# Patient Record
Sex: Female | Born: 1935 | Race: White | Hispanic: No | Smoking: Never smoker
Health system: Southern US, Community
[De-identification: ages and names within clinical notes are randomized; demographics above are authoritative.]

## PROBLEM LIST (undated history)

## (undated) DIAGNOSIS — I10 Essential (primary) hypertension: Secondary | ICD-10-CM

## (undated) DIAGNOSIS — F0390 Unspecified dementia without behavioral disturbance: Secondary | ICD-10-CM

## (undated) DIAGNOSIS — N189 Chronic kidney disease, unspecified: Secondary | ICD-10-CM

## (undated) DIAGNOSIS — Z87442 Personal history of urinary calculi: Secondary | ICD-10-CM

---

## 2021-04-27 ENCOUNTER — Inpatient Hospital Stay (HOSPITAL_COMMUNITY)
Admission: EM | Admit: 2021-04-27 | Discharge: 2021-05-03 | DRG: 871 | Disposition: A | Discharge status: Home Health | Payer: Medicare Other | Attending: Internal Medicine | Admitting: Internal Medicine

## 2021-04-27 ENCOUNTER — Emergency Department (HOSPITAL_COMMUNITY): Payer: Medicare Other

## 2021-04-27 ENCOUNTER — Encounter (HOSPITAL_COMMUNITY): Payer: Self-pay | Admitting: Internal Medicine

## 2021-04-27 ENCOUNTER — Other Ambulatory Visit: Payer: Self-pay

## 2021-04-27 DIAGNOSIS — K8020 Calculus of gallbladder without cholecystitis without obstruction: Secondary | ICD-10-CM | POA: Diagnosis present

## 2021-04-27 DIAGNOSIS — I10 Essential (primary) hypertension: Secondary | ICD-10-CM | POA: Diagnosis not present

## 2021-04-27 DIAGNOSIS — N136 Pyonephrosis: Secondary | ICD-10-CM | POA: Diagnosis present

## 2021-04-27 DIAGNOSIS — I129 Hypertensive chronic kidney disease with stage 1 through stage 4 chronic kidney disease, or unspecified chronic kidney disease: Secondary | ICD-10-CM | POA: Diagnosis present

## 2021-04-27 DIAGNOSIS — N1831 Chronic kidney disease, stage 3a: Secondary | ICD-10-CM | POA: Diagnosis present

## 2021-04-27 DIAGNOSIS — Z79899 Other long term (current) drug therapy: Secondary | ICD-10-CM

## 2021-04-27 DIAGNOSIS — I700 Atherosclerosis of aorta: Secondary | ICD-10-CM | POA: Diagnosis present

## 2021-04-27 DIAGNOSIS — E785 Hyperlipidemia, unspecified: Secondary | ICD-10-CM | POA: Diagnosis present

## 2021-04-27 DIAGNOSIS — A419 Sepsis, unspecified organism: Secondary | ICD-10-CM

## 2021-04-27 DIAGNOSIS — N10 Acute tubulo-interstitial nephritis: Secondary | ICD-10-CM | POA: Diagnosis not present

## 2021-04-27 DIAGNOSIS — G929 Unspecified toxic encephalopathy: Secondary | ICD-10-CM | POA: Diagnosis not present

## 2021-04-27 DIAGNOSIS — N200 Calculus of kidney: Secondary | ICD-10-CM

## 2021-04-27 DIAGNOSIS — Z781 Physical restraint status: Secondary | ICD-10-CM

## 2021-04-27 DIAGNOSIS — K449 Diaphragmatic hernia without obstruction or gangrene: Secondary | ICD-10-CM | POA: Diagnosis present

## 2021-04-27 DIAGNOSIS — F0390 Unspecified dementia without behavioral disturbance: Secondary | ICD-10-CM | POA: Diagnosis present

## 2021-04-27 DIAGNOSIS — E871 Hypo-osmolality and hyponatremia: Secondary | ICD-10-CM | POA: Diagnosis present

## 2021-04-27 DIAGNOSIS — G309 Alzheimer's disease, unspecified: Secondary | ICD-10-CM

## 2021-04-27 DIAGNOSIS — R739 Hyperglycemia, unspecified: Secondary | ICD-10-CM | POA: Diagnosis present

## 2021-04-27 DIAGNOSIS — Z20822 Contact with and (suspected) exposure to covid-19: Secondary | ICD-10-CM | POA: Diagnosis present

## 2021-04-27 DIAGNOSIS — N179 Acute kidney failure, unspecified: Secondary | ICD-10-CM | POA: Diagnosis present

## 2021-04-27 DIAGNOSIS — D696 Thrombocytopenia, unspecified: Secondary | ICD-10-CM | POA: Diagnosis present

## 2021-04-27 DIAGNOSIS — A4150 Gram-negative sepsis, unspecified: Principal | ICD-10-CM | POA: Diagnosis present

## 2021-04-27 DIAGNOSIS — F0280 Dementia in other diseases classified elsewhere without behavioral disturbance: Secondary | ICD-10-CM

## 2021-04-27 DIAGNOSIS — R6520 Severe sepsis without septic shock: Secondary | ICD-10-CM | POA: Diagnosis present

## 2021-04-27 LAB — CBC WITH DIFFERENTIAL/PLATELET
Abs Immature Granulocytes: 0.22 10*3/uL — ABNORMAL HIGH (ref 0.00–0.07)
Basophils Absolute: 0 10*3/uL (ref 0.0–0.1)
Basophils Relative: 0 %
Eosinophils Absolute: 0 10*3/uL (ref 0.0–0.5)
Eosinophils Relative: 0 %
HCT: 40.1 % (ref 36.0–46.0)
Hemoglobin: 13.5 g/dL (ref 12.0–15.0)
Immature Granulocytes: 1 %
Lymphocytes Relative: 5 %
Lymphs Abs: 1.2 10*3/uL (ref 0.7–4.0)
MCH: 31 pg (ref 26.0–34.0)
MCHC: 33.7 g/dL (ref 30.0–36.0)
MCV: 92.2 fL (ref 80.0–100.0)
Monocytes Absolute: 1.7 10*3/uL — ABNORMAL HIGH (ref 0.1–1.0)
Monocytes Relative: 7 %
Neutro Abs: 22.9 10*3/uL — ABNORMAL HIGH (ref 1.7–7.7)
Neutrophils Relative %: 87 %
Platelets: 132 10*3/uL — ABNORMAL LOW (ref 150–400)
RBC: 4.35 MIL/uL (ref 3.87–5.11)
RDW: 13.8 % (ref 11.5–15.5)
WBC: 26 10*3/uL — ABNORMAL HIGH (ref 4.0–10.5)
nRBC: 0 % (ref 0.0–0.2)

## 2021-04-27 LAB — URINALYSIS, ROUTINE W REFLEX MICROSCOPIC
Bilirubin Urine: NEGATIVE
Glucose, UA: NEGATIVE mg/dL
Ketones, ur: NEGATIVE mg/dL
Nitrite: POSITIVE — AB
Protein, ur: >=300 mg/dL — AB
Specific Gravity, Urine: 1.014 (ref 1.005–1.030)
WBC, UA: >50 WBC/hpf — ABNORMAL HIGH (ref 0–5)
pH: 5 (ref 5.0–8.0)

## 2021-04-27 LAB — COMPREHENSIVE METABOLIC PANEL
ALT: 10 U/L (ref 0–44)
AST: 19 U/L (ref 15–41)
Albumin: 3.4 g/dL — ABNORMAL LOW (ref 3.5–5.0)
Alkaline Phosphatase: 63 U/L (ref 38–126)
Anion gap: 4 — ABNORMAL LOW (ref 5–15)
BUN: 29 mg/dL — ABNORMAL HIGH (ref 8–23)
CO2: 29 mmol/L (ref 22–32)
Calcium: 9.3 mg/dL (ref 8.9–10.3)
Chloride: 101 mmol/L (ref 98–111)
Creatinine, Ser: 1.77 mg/dL — ABNORMAL HIGH (ref 0.44–1.00)
GFR, Estimated: 28 mL/min — ABNORMAL LOW (ref >60)
Glucose, Bld: 110 mg/dL — ABNORMAL HIGH (ref 70–99)
Potassium: 4.1 mmol/L (ref 3.5–5.1)
Sodium: 134 mmol/L — ABNORMAL LOW (ref 135–145)
Total Bilirubin: 1.7 mg/dL — ABNORMAL HIGH (ref 0.3–1.2)
Total Protein: 7.1 g/dL (ref 6.5–8.1)

## 2021-04-27 LAB — LACTIC ACID, PLASMA
Lactic Acid, Venous: 1.7 mmol/L (ref 0.5–1.9)
Lactic Acid, Venous: 2.2 mmol/L (ref 0.5–1.9)

## 2021-04-27 LAB — RESP PANEL BY RT-PCR (FLU A&B, COVID) ARPGX2
Influenza A by PCR: NEGATIVE
Influenza B by PCR: NEGATIVE
SARS Coronavirus 2 by RT PCR: NEGATIVE

## 2021-04-27 LAB — APTT: aPTT: 30 seconds (ref 24–36)

## 2021-04-27 LAB — PROTIME-INR
INR: 1.2 (ref 0.8–1.2)
Prothrombin Time: 15 seconds (ref 11.4–15.2)

## 2021-04-27 MED ORDER — LACTATED RINGERS IV BOLUS
1000.0000 mL | Freq: Once | INTRAVENOUS | Status: AC
  Administered 2021-04-27: 1000 mL via INTRAVENOUS

## 2021-04-27 MED ORDER — SODIUM CHLORIDE 0.9 % IV SOLN: 1.0000 g | INTRAVENOUS | Status: DC

## 2021-04-27 MED ORDER — ACETAMINOPHEN 325 MG PO TABS
650.0000 mg | ORAL_TABLET | Freq: Once | ORAL | Status: AC
  Administered 2021-04-27: 650 mg via ORAL
  Filled 2021-04-27: qty 2

## 2021-04-27 MED ORDER — SODIUM CHLORIDE 0.9 % IV SOLN
1.0000 g | INTRAVENOUS | Status: DC
  Administered 2021-04-27: 1 g via INTRAVENOUS
  Filled 2021-04-27: qty 10

## 2021-04-27 MED ORDER — ONDANSETRON HCL 4 MG/2ML IJ SOLN: 4.0000 mg | Freq: Four times a day (QID) | INTRAMUSCULAR | Status: DC | PRN

## 2021-04-27 MED ORDER — ACETAMINOPHEN 325 MG PO TABS
650.0000 mg | ORAL_TABLET | Freq: Four times a day (QID) | ORAL | Status: DC | PRN
Start: 2021-04-27 — End: 2021-05-03
  Administered 2021-04-28 – 2021-05-02 (×2): 650 mg via ORAL
  Filled 2021-04-27 (×2): qty 2

## 2021-04-27 MED ORDER — SODIUM CHLORIDE 0.9 % IV BOLUS
1000.0000 mL | Freq: Once | INTRAVENOUS | Status: AC
  Administered 2021-04-27: 1000 mL via INTRAVENOUS

## 2021-04-27 MED ORDER — LACTATED RINGERS IV BOLUS (SEPSIS): 1000.0000 mL | Freq: Once | INTRAVENOUS | Status: DC

## 2021-04-27 MED ORDER — LACTATED RINGERS IV SOLN: INTRAVENOUS | Status: DC

## 2021-04-27 MED ORDER — ONDANSETRON HCL 4 MG PO TABS: 4.0000 mg | ORAL_TABLET | Freq: Four times a day (QID) | ORAL | Status: DC | PRN

## 2021-04-27 MED ORDER — ACETAMINOPHEN 650 MG RE SUPP: 650.0000 mg | Freq: Four times a day (QID) | RECTAL | Status: DC | PRN

## 2021-04-27 NOTE — ED Triage Notes (Addendum)
As per EMS , pts aid at [facility] called EMS for pt having burning with urination for 3-4 wks, generalized weakness. Pt confused, A&O to self. Pt & EMS Unsure of her medical hx. Denies any complaints.

## 2021-04-27 NOTE — ED Notes (Signed)
Annette S. , pts guardian called, confirmed pt does not have any known allergies and is trying to obtain med list. She can be reached at [phone]

## 2021-04-27 NOTE — H&P (Signed)
History and Physical   Annette Vaughn JIR:[id number] DOB: 18-Jun-1935 DOA: 04/27/2021  Referring MD/NP/PA: Dr. Clarice Pole  PCP: No primary care provider on file.   Outpatient Specialists: None  Patient coming from: [facility]  Chief Complaint: Weakness and dysuria  HPI: Annette Vaughn is a 86 y.o. female with medical history significant of hypertension, hyperlipidemia, dementia, who is here with her caregiver complaining of generalized weakness, dysuria and malaise for the last 3 to 4 weeks.  Patient complained of some mild left flank pain she is not a great historian and has poor hearing.  Patient apparently has gotten progressively weak with loss of appetite.  No fever or chills.  Patient is frail and has relatively been healthy except in the last few weeks.  In the ER work-up showed patient meeting sepsis criteria.  CT abdomen pelvis showed left-sided staghorn calculus and urinalysis is consistent with UTI.  Patient is being admitted with sepsis as a result of infected staghorn stone.  ED Course: Temperature is 101, blood pressure 96/50, pulse 106, respiratory rate of 19, oxygen sats 94% on room air.  White count is 26,000.  Hemoglobin 13.5 and platelets 132.  Sodium 134 chloride 110 and creatinine 1.77 albumin 3.4.  INR 1.2. chest x-ray showed no acute findings.  CT abdomen pelvis showed large staghorn calculus in the left renal pelvis with dilation of upper pole calyces uncontrolled.  Suspected significant hydronephrosis.  Also cholelithiasis with a large gallstone in the gallbladder about 2.5 cm.  Urology and IR consulted and patient is being admitted for further evaluation and treatment  Review of Systems: As per HPI otherwise 10 point review of systems negative.    History reviewed. No pertinent past medical history.  History reviewed. No pertinent surgical history.   reports that she has never smoked. She has never used smokeless tobacco. No history on file for alcohol use and drug  use.  No Known Allergies  History reviewed. No pertinent family history.   Prior to Admission medications   Medication Sig Start Date End Date Taking? Authorizing Provider  atenolol (TENORMIN) 50 MG tablet Take 50 mg by mouth daily.   Yes [provider]  donepezil (ARICEPT) 5 MG tablet Take 5 mg by mouth at bedtime.   Yes [provider]  simvastatin (ZOCOR) 20 MG tablet Take 20 mg by mouth daily.   Yes [provider]  solifenacin (VESICARE) 5 MG tablet Take 5 mg by mouth daily.   Yes [provider]    Physical Exam: Vitals:   04/27/21 1745 04/27/21 1800 04/27/21 1830 04/27/21 1850  BP: (!) 107/46 (!) 110/47 (!) 101/49 104/60  Pulse: 64 60 (!) 53 62  Resp: 17   18  Temp:      TempSrc:      SpO2: 93% 94% 93% 94%  Weight:          Constitutional: Frail, acutely ill looking, hard of hearing, no distress Vitals:   04/27/21 1745 04/27/21 1800 04/27/21 1830 04/27/21 1850  BP: (!) 107/46 (!) 110/47 (!) 101/49 104/60  Pulse: 64 60 (!) 53 62  Resp: 17   18  Temp:      TempSrc:      SpO2: 93% 94% 93% 94%  Weight:       Eyes: PERRL, lids and conjunctivae normal ENMT: Mucous membranes are dry. Posterior pharynx clear of any exudate or lesions.Normal dentition.  Neck: normal, supple, no masses, no thyromegaly Respiratory: clear to auscultation bilaterally, no wheezing, no crackles.  Normal respiratory effort. No accessory muscle use.  Cardiovascular: Regular rate and rhythm, no murmurs / rubs / gallops. No extremity edema. 2+ pedal pulses. No carotid bruits.  Abdomen: Left CVA tenderness, no masses palpated. No hepatosplenomegaly. Bowel sounds positive.  Musculoskeletal: no clubbing / cyanosis. No joint deformity upper and lower extremities. Good ROM, no contractures. Normal muscle tone.  Skin: Dry coarse skin, no rashes, lesions, ulcers. No induration Neurologic: CN 2-12 grossly intact. Sensation intact, DTR normal. Strength 5/5 in all 4.   Decreased hearing Psychiatric: Mild confusion    Labs on Admission: I have personally reviewed following labs and imaging studies  CBC: Recent Labs  Lab 04/27/21 1242  WBC 26.0*  NEUTROABS 22.9*  HGB 13.5  HCT 40.1  MCV 92.2  PLT 132*   Basic Metabolic Panel: Recent Labs  Lab 04/27/21 1242  NA 134*  K 4.1  CL 101  CO2 29  GLUCOSE 110*  BUN 29*  CREATININE 1.77*  CALCIUM 9.3   GFR: CrCl cannot be calculated (Unknown ideal weight.). Liver Function Tests: Recent Labs  Lab 04/27/21 1242  AST 19  ALT 10  ALKPHOS 63  BILITOT 1.7*  PROT 7.1  ALBUMIN 3.4*   No results for input(s): LIPASE, AMYLASE in the last 168 hours. No results for input(s): AMMONIA in the last 168 hours. Coagulation Profile: Recent Labs  Lab 04/27/21 1313  INR 1.2   Cardiac Enzymes: No results for input(s): CKTOTAL, CKMB, CKMBINDEX, TROPONINI in the last 168 hours. BNP (last 3 results) No results for input(s): PROBNP in the last 8760 hours. HbA1C: No results for input(s): HGBA1C in the last 72 hours. CBG: No results for input(s): GLUCAP in the last 168 hours. Lipid Profile: No results for input(s): CHOL, HDL, LDLCALC, TRIG, CHOLHDL, LDLDIRECT in the last 72 hours. Thyroid Function Tests: No results for input(s): TSH, T4TOTAL, FREET4, T3FREE, THYROIDAB in the last 72 hours. Anemia Panel: No results for input(s): VITAMINB12, FOLATE, FERRITIN, TIBC, IRON, RETICCTPCT in the last 72 hours. Urine analysis:    Component Value Date/Time   COLORURINE YELLOW 04/27/2021 1500   APPEARANCEUR CLOUDY (A) 04/27/2021 1500   LABSPEC 1.014 04/27/2021 1500   PHURINE 5.0 04/27/2021 1500   GLUCOSEU NEGATIVE 04/27/2021 1500   HGBUR MODERATE (A) 04/27/2021 1500   BILIRUBINUR NEGATIVE 04/27/2021 1500   KETONESUR NEGATIVE 04/27/2021 1500   PROTEINUR >=300 (A) 04/27/2021 1500   NITRITE POSITIVE (A) 04/27/2021 1500   LEUKOCYTESUR LARGE (A) 04/27/2021 1500   Sepsis  Labs: @LABRCNTIP (procalcitonin:4,lacticidven:4) ) Recent Results (from the past 240 hour(s))  Resp Panel by RT-PCR (Flu A&B, Covid) Nasopharyngeal Swab     Status: None   Collection Time: 04/27/21  1:01 PM   Specimen: Nasopharyngeal Swab; Nasopharyngeal(NP) swabs in vial transport medium  Result Value Ref Range Status   SARS Coronavirus 2 by RT PCR NEGATIVE NEGATIVE Final    Comment: (NOTE) SARS-CoV-2 target nucleic acids are NOT DETECTED.  The SARS-CoV-2 RNA is generally detectable in upper respiratory specimens during the acute phase of infection. The lowest concentration of SARS-CoV-2 viral copies this assay can detect is 138 copies/mL. A negative result does not preclude SARS-Cov-2 infection and should not be used as the sole basis for treatment or other patient management decisions. A negative result may occur with  improper specimen collection/handling, submission of specimen other than nasopharyngeal swab, presence of viral mutation(s) within the areas targeted by this assay, and inadequate number of viral copies(<138 copies/mL). A negative result must be combined with clinical observations,  patient history, and epidemiological information. The expected result is Negative.  Fact Sheet for Patients:  BloggerCourse.com  Fact Sheet for Healthcare Providers:  SeriousBroker.it  This test is no t yet approved or cleared by the [place] FDA and  has been authorized for detection and/or diagnosis of SARS-CoV-2 by FDA under an Emergency Use Authorization (EUA). This EUA will remain  in effect (meaning this test can be used) for the duration of the COVID-19 declaration under Section 564(b)(1) of the Act, 21 U.S.C.section 360bbb-3(b)(1), unless the authorization is terminated  or revoked sooner.       Influenza A by PCR NEGATIVE NEGATIVE Final   Influenza B by PCR NEGATIVE NEGATIVE Final    Comment: (NOTE) The Xpert Xpress  SARS-CoV-2/FLU/RSV plus assay is intended as an aid in the diagnosis of influenza from Nasopharyngeal swab specimens and should not be used as a sole basis for treatment. Nasal washings and aspirates are unacceptable for Xpert Xpress SARS-CoV-2/FLU/RSV testing.  Fact Sheet for Patients: BloggerCourse.com  Fact Sheet for Healthcare Providers: SeriousBroker.it  This test is not yet approved or cleared by the [place] FDA and has been authorized for detection and/or diagnosis of SARS-CoV-2 by FDA under an Emergency Use Authorization (EUA). This EUA will remain in effect (meaning this test can be used) for the duration of the COVID-19 declaration under Section 564(b)(1) of the Act, 21 U.S.C. section 360bbb-3(b)(1), unless the authorization is terminated or revoked.  Performed at [facility], [address], [place], [place] [address]      Radiological Exams on Admission: CT ABDOMEN PELVIS WO CONTRAST  Result Date: 04/27/2021 CLINICAL DATA:  Pain, acute nonlocalized abdominal pain in a 86 year old female. EXAM: CT ABDOMEN AND PELVIS WITHOUT CONTRAST TECHNIQUE: Multidetector CT imaging of the abdomen and pelvis was performed following the standard protocol without IV contrast. COMPARISON:  None FINDINGS: Lower chest: Basilar atelectasis. No substantial pleural effusion. Signs of mitral annular and aortic valvular calcifications. Heart is incompletely imaged. Hepatobiliary: Liver with smooth contours.  Cholelithiasis. Pancreas: Pancreas with normal contour no sign of inflammation. Spleen: Normal size and contour. Adrenals/Urinary Tract: Adrenal glands are normal. Moderate LEFT upper pole hydronephrosis in the setting of large "staghorn type calculus at the LEFT UPJ extending into lower pole calices. This calculus measures 2.6 x 2.6 x 1.6 cm. There is moderate to marked LEFT-sided perinephric stranding. Small low-density  area in the upper pole the LEFT kidney may represent a cyst or small intrarenal collection. The ureter beyond this large calculus is decompressed. The urinary bladder is unremarkable. RIGHT kidney with cortical scarring. Stomach/Bowel: Small hiatal hernia. Small bowel is normal caliber. Some fluid-filled loops throughout the abdomen, no signs of adjacent stranding. Liquid stool in the colon. Minimal diverticular changes in the sigmoid. Vascular/Lymphatic: Calcified atheromatous plaque in the abdominal aorta. Smooth contour of the IVC. There is no gastrohepatic or hepatoduodenal ligament lymphadenopathy. No retroperitoneal or mesenteric lymphadenopathy. No pelvic sidewall lymphadenopathy. Reproductive: Post hysterectomy. Other: No free air.  No ascites. Musculoskeletal: No acute bone finding. No destructive bone process. Spinal degenerative changes. Osteopenia. IMPRESSION: 1. Large staghorn calculus in the LEFT renal pelvis with dilation of upper pole calyces and contour abnormality with low attenuation in the upper pole. Contour abnormality could represent a cyst but is suspicious for small intrarenal collection and or developing XGP in the setting of large staghorn calculus with marked perinephric stranding and associated hydronephrosis. 2. Urologic consultation at or close follow-up may be warranted along with correlation with urine  studies for further evaluation. 3. Cholelithiasis with large gallstone in the gallbladder measuring approximately 2.5 cm greatest dimension. 4. Small hiatal hernia. 5. Minimal diverticular changes in the sigmoid. 6. Aortic atherosclerosis. Aortic Atherosclerosis (ICD10-I70.0). Electronically Signed   By: Donzetta KohutGeoffrey  Wile M.D.   On: 04/27/2021 16:35   DG Chest Portable 1 View  Result Date: 04/27/2021 CLINICAL DATA:  86 year old female with fever. EXAM: PORTABLE CHEST 1 VIEW COMPARISON:  None. FINDINGS: The cardiomediastinal silhouette is unremarkable. There is no evidence of focal  airspace disease, pulmonary edema, suspicious pulmonary nodule/mass, pleural effusion, or pneumothorax. No acute bony abnormalities are identified. IMPRESSION: No active disease. Electronically Signed   By: Harmon PierJeffrey  Hu M.D.   On: 04/27/2021 14:09      Assessment/Plan Principal Problem:   Sepsis (HCC) Active Problems:   Staghorn calculus   Acute pyelonephritis   AKI (acute kidney injury) (HCC)   Essential hypertension   Hyperlipemia   Dementia without behavioral disturbance (HCC)   Hyponatremia   Thrombocytopenia (HCC)   Cholelithiases     #1 sepsis due to UTI: Suspected infected kidney stone.  Patient will be admitted.  Sepsis protocol to be initiated.  IV fluids, IV antibiotics, urine and blood cultures obtained.  Follow closely and adjust antibiotics as needed.  Address staghorn calculus.  #2 obstructive uropathy with staghorn calculus: Urology consulted.  Interventional radiology also consulted.  Plan is for nephrostomy tube tomorrow.  Patient will be kept n.p.o. after midnight.  Continue hydration and supportive care.  #3 acute pyelonephritis: As per #1.  Suspected infected kidney stone.  Empiric IV Rocephin started.  #4 hyperlipidemia: Patient is on statin at home.  We will confirm home regimen and restart  #5 AKI: Secondary to obstructive uropathy.  Continue to monitor renal function.  #6 dementia: No behavioral disturbance.  Continue to monitor  #7 hyponatremia: Most likely dehydration.  Hydrate with saline and monitor  #8 cholelithiasis on CT: Patient so far asymptomatic.  We will just monitor.  #9 thrombocytopenia: Platelets of 132.  Avoiding anticoagulants especially with procedures likely.  Follow platelets level  #10 essential hypertension: Confirm home regimen and reorder home medications   DVT prophylaxis: SCD Code Status: Full Family Communication: Caregiver at bedside [facility] called: Dr. Benancio DeedsNewsome,  urology Admission status: Inpatient  Severity of Illness: The appropriate patient status for this patient is INPATIENT. Inpatient status is judged to be reasonable and necessary in order to provide the required intensity of service to ensure the patient's safety. The patient's presenting symptoms, physical exam findings, and initial radiographic and laboratory data in the context of their chronic comorbidities is felt to place them at high risk for further clinical deterioration. Furthermore, it is not anticipated that the patient will be medically stable for discharge from the hospital within 2 midnights of admission. The following factors support the patient status of inpatient.   " The patient's presenting symptoms include dysuria and fever. " The worrisome physical exam findings include mild CVA tenderness. " The initial radiographic and laboratory data are worrisome because of evidence of staghorn calculus. " The chronic co-morbidities include dementia and hypertension.   * I certify that at the point of admission it is my clinical judgment that the patient will require inpatient hospital care spanning beyond 2 midnights from the point of admission due to high intensity of service, high risk for further deterioration and high frequency of surveillance required.Lonia Blood*    Chairty Toman,LAWAL MD [facility] Pager [phone]  If 7PM-7AM, please contact night-coverage [email or web address] Password [facility]  04/27/2021, 7:53 PM

## 2021-04-27 NOTE — ED Provider Notes (Signed)
  Face-to-face evaluation   History: She presents for evaluation of altered mental status.  She lives at a facility.  She is here with a caregiver.  Patient has poor historian.  Per the caregiver, yesterday the patient complained of chest pain.  Today patient is coughing some but not producing any sputum.  The patient feels like her mouth is dry.  Caregiver felt that patient was "more down today."  Because of that, she summoned EMS to bring the patient here for evaluation.  There are no other reported problems  Physical exam: Elderly female who is alert, cooperative and responsive.  Normal pulse right radius.  Abdomen soft nontender.  Oral mucous membranes are minimally dry.  No oral lesions appreciated.  Sclera nonicteric.  Medical screening examination/treatment/procedure(s) were conducted as a shared visit with non-physician practitioner(s) and myself.  I personally evaluated the patient during the encounter    Mancel Bale, MD 04/28/21 [phone]

## 2021-04-27 NOTE — ED Notes (Signed)
Call placed to [facility] to obtain pts allergy, history, & med list. Pt states she does not know of any allergies at this time. [facility] said they will try to get in touch with someone from independent living to contact us with her medical hx.

## 2021-04-27 NOTE — Progress Notes (Signed)
Date and time results received: 04/27/21 1022 (use smartphrase ".now" to insert current time)  Test: Lactic Acid Critical Value: 2.2  Name of Provider Notified: Blount, Juliann Pares. APP  Orders Received? Or Actions Taken?: 1 Liter Lactated Ringers Bolus ordered and being administered

## 2021-04-27 NOTE — ED Provider Notes (Signed)
Care assumed from [facility], PA-C at shift change pending UA and CT abdomen. Patient will require admission for sepsis most likely related to UTI. See her note for full HPI. Patient evaluated by previous attending, Dr. Effie Shy who agrees with plan.  In short, patient is an 86 year old female who presents to the ED due to dysuria x3 to 4 weeks.  Patient is a resident at [facility].  Caregiver at bedside.  Per caregiver, patient has been more confused than baseline. Patient has a history of dementia. Patient had a fever yesterday per aid.  Level 5 caveat secondary to history of dementia.  Plan from previous provider: Follow-up on UA/CT abdomen then call for admission.  Physical Exam  BP (!) 109/94   Pulse 67   Temp 99.9 F (37.7 C) (Oral)   Resp 19   Wt 61.2 kg   SpO2 100%   Physical Exam Vitals and nursing note reviewed.  Constitutional:      General: She is not in acute distress.    Appearance: She is not ill-appearing.  HENT:     Head: Normocephalic.  Eyes:     Pupils: Pupils are equal, round, and reactive to light.  Cardiovascular:     Rate and Rhythm: Normal rate and regular rhythm.     Pulses: Normal pulses.     Heart sounds: Normal heart sounds. No murmur heard. No friction rub. No gallop.   Pulmonary:     Effort: Pulmonary effort is normal.     Breath sounds: Normal breath sounds.  Abdominal:     General: Abdomen is flat. There is no distension.     Palpations: Abdomen is soft.     Tenderness: There is abdominal tenderness. There is no guarding or rebound.     Comments: Mild suprapubic tenderness  Musculoskeletal:        General: Normal range of motion.     Cervical back: Neck supple.  Skin:    General: Skin is warm and dry.  Neurological:     General: No focal deficit present.     Mental Status: She is alert.  Psychiatric:        Mood and Affect: Mood normal.        Behavior: Behavior normal.     ED Course/Procedures   Clinical Course as of  04/27/21 1554  Sat Apr 27, 2021  1506 Nitrite(!): POSITIVE [CA]  1552 Leukocytes,Ua(!): LARGE [CA]    Clinical Course User Index [CA] Mannie Stabile, PA-C     Results for orders placed or performed during the hospital encounter of 04/27/21 (from the past 24 hour(s))  Lactic acid, plasma     Status: None   Collection Time: 04/27/21 12:42 PM  Result Value Ref Range   Lactic Acid, Venous 1.7 0.5 - 1.9 mmol/L  Comprehensive metabolic panel     Status: Abnormal   Collection Time: 04/27/21 12:42 PM  Result Value Ref Range   Sodium 134 (L) 135 - 145 mmol/L   Potassium 4.1 3.5 - 5.1 mmol/L   Chloride 101 98 - 111 mmol/L   CO2 29 22 - 32 mmol/L   Glucose, Bld 110 (H) 70 - 99 mg/dL   BUN 29 (H) 8 - 23 mg/dL   Creatinine, Ser 6.06 (H) 0.44 - 1.00 mg/dL   Calcium 9.3 8.9 - 30.1 mg/dL   Total Protein 7.1 6.5 - 8.1 g/dL   Albumin 3.4 (L) 3.5 - 5.0 g/dL   AST 19 15 - 41 U/L  ALT 10 0 - 44 U/L   Alkaline Phosphatase 63 38 - 126 U/L   Total Bilirubin 1.7 (H) 0.3 - 1.2 mg/dL   GFR, Estimated 28 (L) >60 mL/min   Anion gap 4 (L) 5 - 15  CBC with Differential     Status: Abnormal   Collection Time: 04/27/21 12:42 PM  Result Value Ref Range   WBC 26.0 (H) 4.0 - 10.5 K/uL   RBC 4.35 3.87 - 5.11 MIL/uL   Hemoglobin 13.5 12.0 - 15.0 g/dL   HCT 40.9 81.1 - 91.4 %   MCV 92.2 80.0 - 100.0 fL   MCH 31.0 26.0 - 34.0 pg   MCHC 33.7 30.0 - 36.0 g/dL   RDW 78.2 95.6 - 21.3 %   Platelets 132 (L) 150 - 400 K/uL   nRBC 0.0 0.0 - 0.2 %   Neutrophils Relative % 87 %   Neutro Abs 22.9 (H) 1.7 - 7.7 K/uL   Lymphocytes Relative 5 %   Lymphs Abs 1.2 0.7 - 4.0 K/uL   Monocytes Relative 7 %   Monocytes Absolute 1.7 (H) 0.1 - 1.0 K/uL   Eosinophils Relative 0 %   Eosinophils Absolute 0.0 0.0 - 0.5 K/uL   Basophils Relative 0 %   Basophils Absolute 0.0 0.0 - 0.1 K/uL   WBC Morphology VACUOLATED NEUTROPHILS    Immature Granulocytes 1 %   Abs Immature Granulocytes 0.22 (H) 0.00 - 0.07 K/uL  Resp  Panel by RT-PCR (Flu A&B, Covid) Nasopharyngeal Swab     Status: None   Collection Time: 04/27/21  1:01 PM   Specimen: Nasopharyngeal Swab; Nasopharyngeal(NP) swabs in vial transport medium  Result Value Ref Range   SARS Coronavirus 2 by RT PCR NEGATIVE NEGATIVE   Influenza A by PCR NEGATIVE NEGATIVE   Influenza B by PCR NEGATIVE NEGATIVE  Protime-INR     Status: None   Collection Time: 04/27/21  1:13 PM  Result Value Ref Range   Prothrombin Time 15.0 11.4 - 15.2 seconds   INR 1.2 0.8 - 1.2  APTT     Status: None   Collection Time: 04/27/21  1:13 PM  Result Value Ref Range   aPTT 30 24 - 36 seconds  Urinalysis, Routine w reflex microscopic Urine, Clean Catch     Status: Abnormal   Collection Time: 04/27/21  3:00 PM  Result Value Ref Range   Color, Urine YELLOW YELLOW   APPearance CLOUDY (A) CLEAR   Specific Gravity, Urine 1.014 1.005 - 1.030   pH 5.0 5.0 - 8.0   Glucose, UA NEGATIVE NEGATIVE mg/dL   Hgb urine dipstick MODERATE (A) NEGATIVE   Bilirubin Urine NEGATIVE NEGATIVE   Ketones, ur NEGATIVE NEGATIVE mg/dL   Protein, ur >=086 (A) NEGATIVE mg/dL   Nitrite POSITIVE (A) NEGATIVE   Leukocytes,Ua LARGE (A) NEGATIVE   RBC / HPF 21-50 0 - 5 RBC/hpf   WBC, UA >50 (H) 0 - 5 WBC/hpf   Bacteria, UA RARE (A) NONE SEEN   Squamous Epithelial / LPF 0-5 0 - 5   Mucus PRESENT    Hyaline Casts, UA PRESENT     .Critical Care Performed by: Mannie Stabile, PA-C Authorized by: Mannie Stabile, PA-C   Critical care provider statement:    Critical care time (minutes):  45   Critical care was necessary to treat or prevent imminent or life-threatening deterioration of the following conditions:  Sepsis   Critical care was time spent personally by me on the following  activities:  Discussions with consultants, evaluation of patient's response to treatment, examination of patient, ordering and performing treatments and interventions, ordering and review of laboratory studies, ordering  and review of radiographic studies, pulse oximetry, re-evaluation of patient's condition, obtaining history from patient or surrogate and review of old charts   I assumed direction of critical care for this patient from another provider in my specialty: no     Care discussed with: admitting provider      MDM  Care assumed from [facility], PA-C at shift change pending UA and CT abdomen. See her note for full MDM.  Female with history of dementia presents to the ED due to worsening confusion and dysuria.  Patient found to be septic upon arrival with fever of 101 F and soft BP. No tachycardia; however, patient is currently on a beta blocker. Suspect source, urosepsis. Patient already given IVFs (30cc/kg) and IV rocephin prior to shift change. I have reviewed all labs from previous provider.  CBC significant for leukocytosis at 26.  Lactic acid normal at 1.7.  CMP significant for hyponatremia at 12/08/1930, hyperglycemia at 110, creatinine 1.77 and BUN at 29.  COVID/influenza negative.  UA significant for moderate hematuria, nitrites, leukocytes.  Urine culture pending.  Chest x-ray personally reviewed which is negative for signs of pneumonia, pneumothorax, or widened mediastinum.  EKG personally reviewed which demonstrates sinus bradycardia with no signs of acute ischemia.  CT abdomen personally reviewed which demonstrates: IMPRESSION:  1. Large staghorn calculus in the LEFT renal pelvis with dilation of  upper pole calyces and contour abnormality with low attenuation in  the upper pole. Contour abnormality could represent a cyst but is  suspicious for small intrarenal collection and or developing XGP in  the setting of large staghorn calculus with marked perinephric  stranding and associated hydronephrosis.  2. Urologic consultation at or close follow-up may be warranted  along with correlation with urine studies for further evaluation.  3. Cholelithiasis with large gallstone in the  gallbladder measuring  approximately 2.5 cm greatest dimension.  4. Small hiatal hernia.  5. Minimal diverticular changes in the sigmoid.  6. Aortic atherosclerosis.   5:05 PM patient's BP decreasing. Rechecked BP in room which was 106/39. 1L IVFs given.   5:51 PM Discussed case with Dr. Benancio Deeds with urology who recommends calling IR for further treatment due to large stone and low likelihood of successful stent from below. He recommends medication admission.  6:17 PM Discussed case with Dr. Mikeal Hawthorne with [facility] who agrees to admit patient for further treatment.   6:52 PM Spoke with IR who is going to call Dr. Benancio Deeds with urology to discuss plan of treatment.     Jesusita Oka 04/27/21 1945    Tilden Fossa, MD 04/27/21 2021

## 2021-04-27 NOTE — ED Notes (Addendum)
Pts caregiver Shamara at bedside who states she sits with patient everyday, for the last two days pt has not been herself ; c/o burning urination today, dark urine noted by caregiver. Pt placed on purewick. Covid test run at facility yesterday, which resulted negative.

## 2021-04-27 NOTE — Progress Notes (Signed)
elinik monitoring for sepsis protocol

## 2021-04-27 NOTE — ED Provider Notes (Signed)
[facility]-EMERGENCY DEPT Provider Note   CSN: [id number] Arrival date & time: 04/27/21  1152     History Chief Complaint  Patient presents with  . Dysuria    Annette Vaughn is a 86 y.o. female.   HPI Patient presents to the emergency department today via EMS with chief complaint of dysuria x 3-4 weeks.  Patient's caregiver from [facility] is at the bedside and provides history.  She admits patient has a history of hypertension and dementia.  She is not on any blood thinners.  She states for the last 3 days patient has been more confused than baseline.  She typically is able to get around with minimal assistance and is alert and oriented to herself and location.  Her aide noticed that she has been complaining of dysuria and when she went to check on her today she was laying on her bed with her pants off very confused.  She states patient had a fever yesterday, unsure exactly how high it was.  Patient has been taking home medications, no over-the-counter medication for symptoms prior to arrival.  She denies any recent fall or head injury.   No past medical history on file.  There are no problems to display for this patient.      OB History   No obstetric history on file.     No family history on file.     Home Medications Prior to Admission medications   Medication Sig Start Date End Date Taking? Authorizing Provider  atenolol (TENORMIN) 50 MG tablet Take 50 mg by mouth daily.   Yes [provider]  donepezil (ARICEPT) 5 MG tablet Take 5 mg by mouth at bedtime.   Yes [provider]  simvastatin (ZOCOR) 20 MG tablet Take 20 mg by mouth daily.   Yes [provider]  solifenacin (VESICARE) 5 MG tablet Take 5 mg by mouth daily.   Yes [provider]    Allergies    Patient has no known allergies.  Review of Systems   Review of Systems  Unable to perform ROS: Dementia     Physical Exam Updated Vital  Signs BP (!) 109/94   Pulse 67   Temp 99.9 F (37.7 C) (Oral)   Resp 19   Wt 61.2 kg   SpO2 100%   Physical Exam Vitals and nursing note reviewed.  Constitutional:      General: She is not in acute distress.    Appearance: She is not ill-appearing.  HENT:     Head: Normocephalic and atraumatic.     Right Ear: Tympanic membrane and external ear normal.     Left Ear: Tympanic membrane and external ear normal.     Nose: Nose normal.     Mouth/Throat:     Mouth: Mucous membranes are dry.     Pharynx: Oropharynx is clear.  Eyes:     General: No scleral icterus.       Right eye: No discharge.        Left eye: No discharge.     Extraocular Movements: Extraocular movements intact.     Conjunctiva/sclera: Conjunctivae normal.     Pupils: Pupils are equal, round, and reactive to light.  Neck:     Vascular: No JVD.  Cardiovascular:     Rate and Rhythm: Normal rate and regular rhythm.     Pulses: Normal pulses.          Radial pulses are 2+ on the right side  and 2+ on the left side.     Heart sounds: Normal heart sounds.  Pulmonary:     Comments: Lungs clear to auscultation in all fields. Symmetric chest rise. No wheezing, rales, or rhonchi. Abdominal:     Tenderness: There is no right CVA tenderness or left CVA tenderness.     Comments: Abdomen is soft, non-distended.  Mild suprapubic tenderness to palpation. No rigidity, no guarding. No peritoneal signs.  Musculoskeletal:        General: Normal range of motion.     Cervical back: Normal range of motion.  Skin:    General: Skin is warm and dry.     Capillary Refill: Capillary refill takes less than 2 seconds.  Neurological:     GCS: GCS eye subscore is 4. GCS verbal subscore is 5. GCS motor subscore is 6.     Comments: Fluent speech, no facial droop.  Alert to self only.  Strong equal grip strength in all extremities.  Psychiatric:        Behavior: Behavior normal.     ED Results / Procedures / Treatments   Labs (all  labs ordered are listed, but only abnormal results are displayed) Labs Reviewed  COMPREHENSIVE METABOLIC PANEL - Abnormal; Notable for the following components:      Result Value   Sodium 134 (*)    Glucose, Bld 110 (*)    BUN 29 (*)    Creatinine, Ser 1.77 (*)    Albumin 3.4 (*)    Total Bilirubin 1.7 (*)    GFR, Estimated 28 (*)    Anion gap 4 (*)    All other components within normal limits  CBC WITH DIFFERENTIAL/PLATELET - Abnormal; Notable for the following components:   WBC 26.0 (*)    Platelets 132 (*)    Neutro Abs 22.9 (*)    Monocytes Absolute 1.7 (*)    Abs Immature Granulocytes 0.22 (*)    All other components within normal limits  RESP PANEL BY RT-PCR (FLU A&B, COVID) ARPGX2  CULTURE, BLOOD (ROUTINE X 2)  CULTURE, BLOOD (ROUTINE X 2)  URINE CULTURE  LACTIC ACID, PLASMA  PROTIME-INR  APTT  URINALYSIS, ROUTINE W REFLEX MICROSCOPIC    EKG EKG Interpretation  Date/Time:  Saturday Apr 27 2021 13:32:16 EDT Ventricular Rate:  51 PR Interval:  174 QRS Duration: 70 QT Interval:  438 QTC Calculation: 403 R Axis:   37 Text Interpretation: Sinus bradycardia Septal infarct , age undetermined Abnormal ECG No old tracing to compare Confirmed by Mancel Bale [phone]) on 04/27/2021 1:57:17 PM   Radiology DG Chest Portable 1 View  Result Date: 04/27/2021 CLINICAL DATA:  86 year old female with fever. EXAM: PORTABLE CHEST 1 VIEW COMPARISON:  None. FINDINGS: The cardiomediastinal silhouette is unremarkable. There is no evidence of focal airspace disease, pulmonary edema, suspicious pulmonary nodule/mass, pleural effusion, or pneumothorax. No acute bony abnormalities are identified. IMPRESSION: No active disease. Electronically Signed   By: Harmon Pier M.D.   On: 04/27/2021 14:09    Procedures Procedures   Medications Ordered in ED Medications  cefTRIAXone (ROCEPHIN) 1 g in sodium chloride 0.9 % 100 mL IVPB (0 g Intravenous Stopped 04/27/21 1400)  sodium chloride 0.9 %  bolus 1,000 mL (0 mLs Intravenous Stopped 04/27/21 1500)  sodium chloride 0.9 % bolus 1,000 mL (0 mLs Intravenous Stopped 04/27/21 1445)  acetaminophen (TYLENOL) tablet 650 mg (650 mg Oral Given 04/27/21 1503)    ED Course  I have reviewed the triage vital signs and  the nursing notes.  Pertinent labs & imaging results that were available during my care of the patient were reviewed by me and considered in my medical decision making (see chart for details).    MDM Rules/Calculators/A&P                          History provided by caregiver at the bedside.  Patient presenting with concern for sepsis.  On ED arrival she is febrile to 101, blood pressures are soft with systolic in the 90s.  No tachycardia although on a beta-blocker.  On exam patient looks dehydrated.  She has mild suprapubic tenderness palpation without peritoneal signs.  Sepsis work-up initiated and code sepsis was called after initial exam.  Tylenol given for fever.  Rocephin given for concern for urosepsis.  Patient given 2 L normal saline which is equivalent to 30 cc/kg.  CBC significant for leukocytosis of 26.  No anemia.  She does have thrombocytopenia with a platelet count of 132.  There are no prior labs to compare.  CMP with BUN/creatinine of 29/1.77, total bilirubin is 1.7 and GFR is 28.  She has a low anion gap of 4.  COVID test is negative.  INR and PTT are within normal range.  Chest x-ray shows no acute infectious process.  I agree with radiologist impression.  On reassessment blood pressure is responding to fluids and fever is downtrending. UA and CT abdomen pelvis are in process at time of shift change.  Patient care transferred to C. Aberman PA-C at the end of my shift. Patient presentation, ED course, and plan of care discussed with review of all pertinent labs and imaging. Please see her note for further details regarding further ED course and disposition.  Anticipate admission for sepsis.   Portions of this note  were generated with Scientist, clinical (histocompatibility and immunogenetics). Dictation errors may occur despite best attempts at proofreading. Final Clinical Impression(s) / ED Diagnoses Final diagnoses:  Sepsis, due to unspecified organism, unspecified whether acute organ dysfunction present [facility])    Rx / DC Orders ED Discharge Orders    None       Kandice Hams 04/27/21 1523    Mancel Bale, MD 04/28/21 [phone]

## 2021-04-28 ENCOUNTER — Inpatient Hospital Stay (HOSPITAL_COMMUNITY): Payer: Medicare Other

## 2021-04-28 DIAGNOSIS — E785 Hyperlipidemia, unspecified: Secondary | ICD-10-CM

## 2021-04-28 HISTORY — PX: IR NEPHROSTOMY PLACEMENT LEFT: IMG6063

## 2021-04-28 LAB — CBC
HCT: 36.4 % (ref 36.0–46.0)
Hemoglobin: 12.2 g/dL (ref 12.0–15.0)
MCH: 31.2 pg (ref 26.0–34.0)
MCHC: 33.5 g/dL (ref 30.0–36.0)
MCV: 93.1 fL (ref 80.0–100.0)
Platelets: 112 10*3/uL — ABNORMAL LOW (ref 150–400)
RBC: 3.91 MIL/uL (ref 3.87–5.11)
RDW: 14 % (ref 11.5–15.5)
WBC: 18.5 10*3/uL — ABNORMAL HIGH (ref 4.0–10.5)
nRBC: 0 % (ref 0.0–0.2)

## 2021-04-28 LAB — BLOOD CULTURE ID PANEL (REFLEXED) - BCID2

## 2021-04-28 LAB — COMPREHENSIVE METABOLIC PANEL
ALT: 12 U/L (ref 0–44)
AST: 20 U/L (ref 15–41)
Albumin: 2.7 g/dL — ABNORMAL LOW (ref 3.5–5.0)
Alkaline Phosphatase: 53 U/L (ref 38–126)
Anion gap: 5 (ref 5–15)
BUN: 25 mg/dL — ABNORMAL HIGH (ref 8–23)
CO2: 22 mmol/L (ref 22–32)
Calcium: 8.5 mg/dL — ABNORMAL LOW (ref 8.9–10.3)
Chloride: 109 mmol/L (ref 98–111)
Creatinine, Ser: 1.32 mg/dL — ABNORMAL HIGH (ref 0.44–1.00)
GFR, Estimated: 40 mL/min — ABNORMAL LOW (ref >60)
Glucose, Bld: 101 mg/dL — ABNORMAL HIGH (ref 70–99)
Potassium: 3.7 mmol/L (ref 3.5–5.1)
Sodium: 136 mmol/L (ref 135–145)
Total Bilirubin: 1.3 mg/dL — ABNORMAL HIGH (ref 0.3–1.2)
Total Protein: 5.6 g/dL — ABNORMAL LOW (ref 6.5–8.1)

## 2021-04-28 LAB — PROTIME-INR
INR: 1.2 (ref 0.8–1.2)
Prothrombin Time: 15.5 seconds — ABNORMAL HIGH (ref 11.4–15.2)

## 2021-04-28 LAB — LACTIC ACID, PLASMA: Lactic Acid, Venous: 1.6 mmol/L (ref 0.5–1.9)

## 2021-04-28 LAB — CORTISOL-AM, BLOOD: Cortisol - AM: 41.8 ug/dL — ABNORMAL HIGH (ref 6.7–22.6)

## 2021-04-28 LAB — PROCALCITONIN: Procalcitonin: 3.48 ng/mL

## 2021-04-28 MED ORDER — HALOPERIDOL LACTATE 5 MG/ML IJ SOLN
1.0000 mg | Freq: Four times a day (QID) | INTRAMUSCULAR | Status: DC | PRN
  Administered 2021-04-28: 1 mg via INTRAMUSCULAR
  Filled 2021-04-28: qty 1

## 2021-04-28 MED ORDER — FENTANYL CITRATE (PF) 100 MCG/2ML IJ SOLN
INTRAMUSCULAR | Status: AC
  Filled 2021-04-28: qty 2

## 2021-04-28 MED ORDER — LIP MEDEX EX OINT
TOPICAL_OINTMENT | CUTANEOUS | Status: AC
  Filled 2021-04-28: qty 7

## 2021-04-28 MED ORDER — IOHEXOL 300 MG/ML  SOLN
25.0000 mL | Freq: Once | INTRAMUSCULAR | Status: AC | PRN
  Administered 2021-04-28: 10 mL

## 2021-04-28 MED ORDER — MIDAZOLAM HCL 2 MG/2ML IJ SOLN
INTRAMUSCULAR | Status: AC | PRN
  Administered 2021-04-28: 1 mg via INTRAVENOUS

## 2021-04-28 MED ORDER — MIDAZOLAM HCL 2 MG/2ML IJ SOLN
INTRAMUSCULAR | Status: AC
  Filled 2021-04-28: qty 2

## 2021-04-28 MED ORDER — HALOPERIDOL LACTATE 5 MG/ML IJ SOLN
2.0000 mg | Freq: Once | INTRAMUSCULAR | Status: AC
  Administered 2021-04-28: 2 mg via INTRAVENOUS
  Filled 2021-04-28: qty 1

## 2021-04-28 MED ORDER — ATENOLOL 50 MG PO TABS: 50.0000 mg | ORAL_TABLET | Freq: Every day | ORAL | Status: DC

## 2021-04-28 MED ORDER — FENTANYL CITRATE (PF) 100 MCG/2ML IJ SOLN
INTRAMUSCULAR | Status: AC | PRN
  Administered 2021-04-28: 25 ug via INTRAVENOUS

## 2021-04-28 MED ORDER — DARIFENACIN HYDROBROMIDE ER 7.5 MG PO TB24
7.5000 mg | ORAL_TABLET | Freq: Every day | ORAL | Status: DC
  Administered 2021-04-28 – 2021-05-03 (×6): 7.5 mg via ORAL
  Filled 2021-04-28 (×6): qty 1

## 2021-04-28 MED ORDER — SIMVASTATIN 20 MG PO TABS
20.0000 mg | ORAL_TABLET | Freq: Every day | ORAL | Status: DC
  Administered 2021-04-29 – 2021-05-02 (×4): 20 mg via ORAL
  Filled 2021-04-28 (×4): qty 1

## 2021-04-28 MED ORDER — HALOPERIDOL 0.5 MG PO TABS
1.0000 mg | ORAL_TABLET | Freq: Four times a day (QID) | ORAL | Status: DC | PRN
  Administered 2021-04-28 – 2021-05-01 (×3): 1 mg via ORAL
  Filled 2021-04-28 (×3): qty 2

## 2021-04-28 MED ORDER — DONEPEZIL HCL 10 MG PO TABS
10.0000 mg | ORAL_TABLET | Freq: Every day | ORAL | Status: DC
  Administered 2021-04-28 – 2021-05-02 (×5): 10 mg via ORAL
  Filled 2021-04-28 (×5): qty 1

## 2021-04-28 MED ORDER — SODIUM CHLORIDE 0.9 % IV SOLN
2.0000 g | INTRAVENOUS | Status: DC
  Administered 2021-04-28 – 2021-04-30 (×3): 2 g via INTRAVENOUS
  Filled 2021-04-28 (×3): qty 20
  Filled 2021-04-28: qty 2

## 2021-04-28 MED ORDER — DEXTROSE-NACL 5-0.9 % IV SOLN: INTRAVENOUS | Status: DC

## 2021-04-28 MED ORDER — LIDOCAINE HCL 1 % IJ SOLN
INTRAMUSCULAR | Status: AC
  Filled 2021-04-28: qty 20

## 2021-04-28 NOTE — Progress Notes (Signed)
PHARMACY - PHYSICIAN COMMUNICATION CRITICAL VALUE ALERT - BLOOD CULTURE IDENTIFICATION (BCID)  Annette Vaughn is an 86 y.o. female who presented to [facility] on 04/27/2021 with a chief complaint of urosepsis  Assessment: urosepsis from staghorn calculus  Name of physician (or Provider) Contacted: Arrien via [facility]  Current antibiotics: Ceftriaxone 1 gm q24  Changes to prescribed antibiotics recommended:  Ceftriaxone 2 gm IV q24  Results for orders placed or performed during the hospital encounter of 04/27/21  Blood Culture ID Panel (Reflexed) (Collected: 04/27/2021  1:00 PM)  Result Value Ref Range   Enterococcus faecalis NOT DETECTED NOT DETECTED   Enterococcus Faecium NOT DETECTED NOT DETECTED   Listeria monocytogenes NOT DETECTED NOT DETECTED   Staphylococcus species NOT DETECTED NOT DETECTED   Staphylococcus aureus (BCID) NOT DETECTED NOT DETECTED   Staphylococcus epidermidis NOT DETECTED NOT DETECTED   Staphylococcus lugdunensis NOT DETECTED NOT DETECTED   Streptococcus species NOT DETECTED NOT DETECTED   Streptococcus agalactiae NOT DETECTED NOT DETECTED   Streptococcus pneumoniae NOT DETECTED NOT DETECTED   Streptococcus pyogenes NOT DETECTED NOT DETECTED   A.calcoaceticus-baumannii NOT DETECTED NOT DETECTED   Bacteroides fragilis NOT DETECTED NOT DETECTED   Enterobacterales DETECTED (A) NOT DETECTED   Enterobacter cloacae complex NOT DETECTED NOT DETECTED   Escherichia coli NOT DETECTED NOT DETECTED   Klebsiella aerogenes NOT DETECTED NOT DETECTED   Klebsiella oxytoca NOT DETECTED NOT DETECTED   Klebsiella pneumoniae DETECTED (A) NOT DETECTED   Proteus species NOT DETECTED NOT DETECTED   Salmonella species NOT DETECTED NOT DETECTED   Serratia marcescens NOT DETECTED NOT DETECTED   Haemophilus influenzae NOT DETECTED NOT DETECTED   Neisseria meningitidis NOT DETECTED NOT DETECTED   Pseudomonas aeruginosa NOT DETECTED NOT DETECTED   Stenotrophomonas maltophilia NOT DETECTED  NOT DETECTED   Candida albicans NOT DETECTED NOT DETECTED   Candida auris NOT DETECTED NOT DETECTED   Candida glabrata NOT DETECTED NOT DETECTED   Candida krusei NOT DETECTED NOT DETECTED   Candida parapsilosis NOT DETECTED NOT DETECTED   Candida tropicalis NOT DETECTED NOT DETECTED   Cryptococcus neoformans/gattii NOT DETECTED NOT DETECTED   CTX-M ESBL NOT DETECTED NOT DETECTED   Carbapenem resistance IMP NOT DETECTED NOT DETECTED   Carbapenem resistance KPC NOT DETECTED NOT DETECTED   Carbapenem resistance NDM NOT DETECTED NOT DETECTED   Carbapenem resist OXA 48 LIKE NOT DETECTED NOT DETECTED   Carbapenem resistance VIM NOT DETECTED NOT DETECTED   Herby Abraham, Pharm.D 04/28/2021 7:59 AM

## 2021-04-28 NOTE — Progress Notes (Signed)
Pt very restless this afternoon, very confused, pulling off her telemetry leads and also pulling at her new Left nephrostomy tube. Pt has private sitter at her bedside but continues to pull at her IV's, nephrostomy tube inspite of attempts to reorient her.  Dr Ella Jubilee notified, mittens applied and Haldol given IM per order. Will cont to monitor.

## 2021-04-28 NOTE — Procedures (Signed)
Interventional Radiology Procedure Note  Procedure: Placement of a 87F percutaneous nephrostomy tube into the LEFT kidney via posterior lower pole access.    Complications: None  Estimated Blood Loss: None  Recommendations: - Return to room - Urine sent for cx - Tube to bag  Signed,  Sterling Big, MD

## 2021-04-28 NOTE — Consult Note (Signed)
Urology Consult   Physician requesting consult: Mikeal HawthorneGarba  Reason for consult: Left staghorn calculus with urosepsis  History of Present Illness: Annette Vaughn is a 86 y.o. female who presented to the emergency room last night with progressive dysuria weakness and malaise.  Noted to have fever to 101 white count 26,000 and urine appeared infected.  CT urogram was obtained showing staghorn calculus using the entire renal pelvis extending into the proximal ureter and what appears to be probable upper pole hydronephrosis from outlet obstruction of the kidney.  There is also a 2 and half centimeter gallstone noted.  Urology asked to evaluate for stone and urosepsis.  She denies a history of voiding or storage urinary symptoms, hematuria, UTIs, STDs, urolithiasis, GU malignancy/trauma/surgery.  History reviewed. No pertinent past medical history.  History reviewed. No pertinent surgical history.   Current Hospital Medications:  Home meds:  No current facility-administered medications on file prior to encounter.   Current Outpatient Medications on File Prior to Encounter  Medication Sig Dispense Refill  . atenolol (TENORMIN) 50 MG tablet Take 50 mg by mouth daily.    Marland Kitchen. donepezil (ARICEPT) 5 MG tablet Take 5 mg by mouth at bedtime.    . simvastatin (ZOCOR) 20 MG tablet Take 20 mg by mouth daily.    . solifenacin (VESICARE) 5 MG tablet Take 5 mg by mouth daily.       Scheduled Meds: Continuous Infusions: . cefTRIAXone (ROCEPHIN)  IV    . lactated ringers 100 mL/hr at 04/28/21 0500   PRN Meds:.acetaminophen **OR** acetaminophen, ondansetron **OR** ondansetron (ZOFRAN) IV  Allergies: No Known Allergies  History reviewed. No pertinent family history.  Social History:  reports that she has never smoked. She has never used smokeless tobacco. No history on file for alcohol use and drug use.  ROS: A complete review of systems was performed.  All systems are negative except for pertinent  findings as noted.  Physical Exam:  Vital signs in last 24 hours: Temp:  [98.2 F (36.8 C)-101 F (38.3 C)] 98.2 F (36.8 C) (05/22 0526) Pulse Rate:  [50-106] 50 (05/22 0526) Resp:  [14-23] 14 (05/22 0526) BP: (93-133)/(40-94) 133/51 (05/22 0526) SpO2:  [80 %-100 %] 95 % (05/22 0526) Weight:  [61.2 kg] 61.2 kg (05/21 1212) Constitutional:  Alert and oriented, No acute distress Cardiovascular: Regular rate and rhythm, No JVD Respiratory: Normal respiratory effort, Lungs clear bilaterally GI: Abdomen is soft, nontender, nondistended, no abdominal masses,  GU: Mild Left CVA tenderness Lymphatic: No lymphadenopathy Neurologic: Grossly intact, no focal deficits Psychiatric: Normal mood and affect,pt with underlying dementia but converses well  Laboratory Data:  Recent Labs    04/27/21 1242 04/28/21 0029  WBC 26.0* 18.5*  HGB 13.5 12.2  HCT 40.1 36.4  PLT 132* 112*    Recent Labs    04/27/21 1242 04/28/21 0029  NA 134* 136  K 4.1 3.7  CL 101 109  GLUCOSE 110* 101*  BUN 29* 25*  CALCIUM 9.3 8.5*  CREATININE 1.77* 1.32*     Results for orders placed or performed during the hospital encounter of 04/27/21 (from the past 24 hour(s))  Lactic acid, plasma     Status: None   Collection Time: 04/27/21 12:42 PM  Result Value Ref Range   Lactic Acid, Venous 1.7 0.5 - 1.9 mmol/L  Comprehensive metabolic panel     Status: Abnormal   Collection Time: 04/27/21 12:42 PM  Result Value Ref Range   Sodium 134 (L) 135 - 145 mmol/L  Potassium 4.1 3.5 - 5.1 mmol/L   Chloride 101 98 - 111 mmol/L   CO2 29 22 - 32 mmol/L   Glucose, Bld 110 (H) 70 - 99 mg/dL   BUN 29 (H) 8 - 23 mg/dL   Creatinine, Ser 1.61 (H) 0.44 - 1.00 mg/dL   Calcium 9.3 8.9 - 09.6 mg/dL   Total Protein 7.1 6.5 - 8.1 g/dL   Albumin 3.4 (L) 3.5 - 5.0 g/dL   AST 19 15 - 41 U/L   ALT 10 0 - 44 U/L   Alkaline Phosphatase 63 38 - 126 U/L   Total Bilirubin 1.7 (H) 0.3 - 1.2 mg/dL   GFR, Estimated 28 (L) >60  mL/min   Anion gap 4 (L) 5 - 15  CBC with Differential     Status: Abnormal   Collection Time: 04/27/21 12:42 PM  Result Value Ref Range   WBC 26.0 (H) 4.0 - 10.5 K/uL   RBC 4.35 3.87 - 5.11 MIL/uL   Hemoglobin 13.5 12.0 - 15.0 g/dL   HCT 04.5 40.9 - 81.1 %   MCV 92.2 80.0 - 100.0 fL   MCH 31.0 26.0 - 34.0 pg   MCHC 33.7 30.0 - 36.0 g/dL   RDW 91.4 78.2 - 95.6 %   Platelets 132 (L) 150 - 400 K/uL   nRBC 0.0 0.0 - 0.2 %   Neutrophils Relative % 87 %   Neutro Abs 22.9 (H) 1.7 - 7.7 K/uL   Lymphocytes Relative 5 %   Lymphs Abs 1.2 0.7 - 4.0 K/uL   Monocytes Relative 7 %   Monocytes Absolute 1.7 (H) 0.1 - 1.0 K/uL   Eosinophils Relative 0 %   Eosinophils Absolute 0.0 0.0 - 0.5 K/uL   Basophils Relative 0 %   Basophils Absolute 0.0 0.0 - 0.1 K/uL   WBC Morphology VACUOLATED NEUTROPHILS    Immature Granulocytes 1 %   Abs Immature Granulocytes 0.22 (H) 0.00 - 0.07 K/uL  Culture, blood (routine x 2)     Status: None (Preliminary result)   Collection Time: 04/27/21  1:00 PM   Specimen: BLOOD  Result Value Ref Range   Specimen Description      BLOOD RIGHT ARM Performed at [facility], [address] [place], [place] [address]    Special Requests      BOTTLES DRAWN AEROBIC AND ANAEROBIC Blood Culture results may not be optimal due to an inadequate volume of blood received in culture bottles Performed at [facility], [address], [place], [place] [address]    Culture  Setup Time      GRAM NEGATIVE RODS IN BOTH AEROBIC AND ANAEROBIC BOTTLES Organism ID to follow Performed at [facility], [address], [place], [place] [address]    Culture GRAM NEGATIVE RODS    Report Status PENDING   Resp Panel by RT-PCR (Flu A&B, Covid) Nasopharyngeal Swab     Status: None   Collection Time: 04/27/21  1:01 PM   Specimen: Nasopharyngeal Swab; Nasopharyngeal(NP) swabs in vial transport medium  Result Value Ref Range   SARS Coronavirus 2 by RT  PCR NEGATIVE NEGATIVE   Influenza A by PCR NEGATIVE NEGATIVE   Influenza B by PCR NEGATIVE NEGATIVE  Culture, blood (routine x 2)     Status: None (Preliminary result)   Collection Time: 04/27/21  1:05 PM   Specimen: BLOOD  Result Value Ref Range   Specimen Description      BLOOD LEFT ARM  Performed at [facility], [address] [place], [place] [address]    Special Requests      BOTTLES DRAWN AEROBIC AND ANAEROBIC Blood Culture results may not be optimal due to an excessive volume of blood received in culture bottles Performed at [facility], [address] [place], [place] [address]    Culture  Setup Time      GRAM NEGATIVE RODS IN BOTH AEROBIC AND ANAEROBIC BOTTLES Performed at [facility], [address], [place], [place] [address]    Culture GRAM NEGATIVE RODS    Report Status PENDING   Protime-INR     Status: None   Collection Time: 04/27/21  1:13 PM  Result Value Ref Range   Prothrombin Time 15.0 11.4 - 15.2 seconds   INR 1.2 0.8 - 1.2  APTT     Status: None   Collection Time: 04/27/21  1:13 PM  Result Value Ref Range   aPTT 30 24 - 36 seconds  Urinalysis, Routine w reflex microscopic Urine, Clean Catch     Status: Abnormal   Collection Time: 04/27/21  3:00 PM  Result Value Ref Range   Color, Urine YELLOW YELLOW   APPearance CLOUDY (A) CLEAR   Specific Gravity, Urine 1.014 1.005 - 1.030   pH 5.0 5.0 - 8.0   Glucose, UA NEGATIVE NEGATIVE mg/dL   Hgb urine dipstick MODERATE (A) NEGATIVE   Bilirubin Urine NEGATIVE NEGATIVE   Ketones, ur NEGATIVE NEGATIVE mg/dL   Protein, ur >=021 (A) NEGATIVE mg/dL   Nitrite POSITIVE (A) NEGATIVE   Leukocytes,Ua LARGE (A) NEGATIVE   RBC / HPF 21-50 0 - 5 RBC/hpf   WBC, UA >50 (H) 0 - 5 WBC/hpf   Bacteria, UA RARE (A) NONE SEEN   Squamous Epithelial / LPF 0-5 0 - 5   Mucus PRESENT    Hyaline Casts, UA PRESENT   Lactic acid, plasma     Status: Abnormal   Collection Time: 04/27/21   9:17 PM  Result Value Ref Range   Lactic Acid, Venous 2.2 (HH) 0.5 - 1.9 mmol/L  Protime-INR     Status: Abnormal   Collection Time: 04/28/21 12:29 AM  Result Value Ref Range   Prothrombin Time 15.5 (H) 11.4 - 15.2 seconds   INR 1.2 0.8 - 1.2  Cortisol-am, blood     Status: Abnormal   Collection Time: 04/28/21 12:29 AM  Result Value Ref Range   Cortisol - AM 41.8 (H) 6.7 - 22.6 ug/dL  Procalcitonin     Status: None   Collection Time: 04/28/21 12:29 AM  Result Value Ref Range   Procalcitonin 3.48 ng/mL  CBC     Status: Abnormal   Collection Time: 04/28/21 12:29 AM  Result Value Ref Range   WBC 18.5 (H) 4.0 - 10.5 K/uL   RBC 3.91 3.87 - 5.11 MIL/uL   Hemoglobin 12.2 12.0 - 15.0 g/dL   HCT 11.7 35.6 - 70.1 %   MCV 93.1 80.0 - 100.0 fL   MCH 31.2 26.0 - 34.0 pg   MCHC 33.5 30.0 - 36.0 g/dL   RDW 41.0 30.1 - 31.4 %   Platelets 112 (L) 150 - 400 K/uL   nRBC 0.0 0.0 - 0.2 %  Comprehensive metabolic panel     Status: Abnormal   Collection Time: 04/28/21 12:29 AM  Result Value Ref Range   Sodium 136 135 - 145 mmol/L   Potassium 3.7 3.5 - 5.1 mmol/L   Chloride 109  98 - 111 mmol/L   CO2 22 22 - 32 mmol/L   Glucose, Bld 101 (H) 70 - 99 mg/dL   BUN 25 (H) 8 - 23 mg/dL   Creatinine, Ser 2.68 (H) 0.44 - 1.00 mg/dL   Calcium 8.5 (L) 8.9 - 10.3 mg/dL   Total Protein 5.6 (L) 6.5 - 8.1 g/dL   Albumin 2.7 (L) 3.5 - 5.0 g/dL   AST 20 15 - 41 U/L   ALT 12 0 - 44 U/L   Alkaline Phosphatase 53 38 - 126 U/L   Total Bilirubin 1.3 (H) 0.3 - 1.2 mg/dL   GFR, Estimated 40 (L) >60 mL/min   Anion gap 5 5 - 15  Lactic acid, plasma     Status: None   Collection Time: 04/28/21 12:29 AM  Result Value Ref Range   Lactic Acid, Venous 1.6 0.5 - 1.9 mmol/L   Recent Results (from the past 240 hour(s))  Culture, blood (routine x 2)     Status: None (Preliminary result)   Collection Time: 04/27/21  1:00 PM   Specimen: BLOOD  Result Value Ref Range Status   Specimen Description   Final    BLOOD RIGHT  ARM Performed at [facility], [address], [place], [place] [address]    Special Requests   Final    BOTTLES DRAWN AEROBIC AND ANAEROBIC Blood Culture results may not be optimal due to an inadequate volume of blood received in culture bottles Performed at [facility], [address] [place], [place] [address]    Culture  Setup Time   Final    GRAM NEGATIVE RODS IN BOTH AEROBIC AND ANAEROBIC BOTTLES Organism ID to follow Performed at [facility], [address] [place], [place] [address]    Culture GRAM NEGATIVE RODS  Final   Report Status PENDING  Incomplete  Resp Panel by RT-PCR (Flu A&B, Covid) Nasopharyngeal Swab     Status: None   Collection Time: 04/27/21  1:01 PM   Specimen: Nasopharyngeal Swab; Nasopharyngeal(NP) swabs in vial transport medium  Result Value Ref Range Status   SARS Coronavirus 2 by RT PCR NEGATIVE NEGATIVE Final    Comment: (NOTE) SARS-CoV-2 target nucleic acids are NOT DETECTED.  The SARS-CoV-2 RNA is generally detectable in upper respiratory specimens during the acute phase of infection. The lowest concentration of SARS-CoV-2 viral copies this assay can detect is 138 copies/mL. A negative result does not preclude SARS-Cov-2 infection and should not be used as the sole basis for treatment or other patient management decisions. A negative result may occur with  improper specimen collection/handling, submission of specimen other than nasopharyngeal swab, presence of viral mutation(s) within the areas targeted by this assay, and inadequate number of viral copies(<138 copies/mL). A negative result must be combined with clinical observations, patient history, and epidemiological information. The expected result is Negative.  Fact Sheet for Patients:  BloggerCourse.com  Fact Sheet for Healthcare Providers:  SeriousBroker.it  This test is no t yet approved or  cleared by the [place] FDA and  has been authorized for detection and/or diagnosis of SARS-CoV-2 by FDA under an Emergency Use Authorization (EUA). This EUA will remain  in effect (meaning this test can be used) for the duration of the COVID-19 declaration under Section 564(b)(1) of the Act, 21 U.S.C.section 360bbb-3(b)(1), unless the authorization is terminated  or revoked sooner.       Influenza A by PCR NEGATIVE NEGATIVE Final   Influenza B  by PCR NEGATIVE NEGATIVE Final    Comment: (NOTE) The Xpert Xpress SARS-CoV-2/FLU/RSV plus assay is intended as an aid in the diagnosis of influenza from Nasopharyngeal swab specimens and should not be used as a sole basis for treatment. Nasal washings and aspirates are unacceptable for Xpert Xpress SARS-CoV-2/FLU/RSV testing.  Fact Sheet for Patients: BloggerCourse.com  Fact Sheet for Healthcare Providers: SeriousBroker.it  This test is not yet approved or cleared by the [place] FDA and has been authorized for detection and/or diagnosis of SARS-CoV-2 by FDA under an Emergency Use Authorization (EUA). This EUA will remain in effect (meaning this test can be used) for the duration of the COVID-19 declaration under Section 564(b)(1) of the Act, 21 U.S.C. section 360bbb-3(b)(1), unless the authorization is terminated or revoked.  Performed at [facility], [address], [place], [place] [address]   Culture, blood (routine x 2)     Status: None (Preliminary result)   Collection Time: 04/27/21  1:05 PM   Specimen: BLOOD  Result Value Ref Range Status   Specimen Description   Final    BLOOD LEFT ARM Performed at [facility], [address] [place], [place] [address]    Special Requests   Final    BOTTLES DRAWN AEROBIC AND ANAEROBIC Blood Culture results may not be optimal due to an excessive volume of blood received in culture  bottles Performed at [facility], [address], [place], [place] [address]    Culture  Setup Time   Final    GRAM NEGATIVE RODS IN BOTH AEROBIC AND ANAEROBIC BOTTLES Performed at [facility], [address] [place], [place] [address]    Culture GRAM NEGATIVE RODS  Final   Report Status PENDING  Incomplete    Renal Function: Recent Labs    04/27/21 1242 04/28/21 0029  CREATININE 1.77* 1.32*   CrCl cannot be calculated (Unknown ideal weight.).  Radiologic Imaging: CT ABDOMEN PELVIS WO CONTRAST  Result Date: 04/27/2021 CLINICAL DATA:  Pain, acute nonlocalized abdominal pain in a 86 year old female. EXAM: CT ABDOMEN AND PELVIS WITHOUT CONTRAST TECHNIQUE: Multidetector CT imaging of the abdomen and pelvis was performed following the standard protocol without IV contrast. COMPARISON:  None FINDINGS: Lower chest: Basilar atelectasis. No substantial pleural effusion. Signs of mitral annular and aortic valvular calcifications. Heart is incompletely imaged. Hepatobiliary: Liver with smooth contours.  Cholelithiasis. Pancreas: Pancreas with normal contour no sign of inflammation. Spleen: Normal size and contour. Adrenals/Urinary Tract: Adrenal glands are normal. Moderate LEFT upper pole hydronephrosis in the setting of large "staghorn type calculus at the LEFT UPJ extending into lower pole calices. This calculus measures 2.6 x 2.6 x 1.6 cm. There is moderate to marked LEFT-sided perinephric stranding. Small low-density area in the upper pole the LEFT kidney may represent a cyst or small intrarenal collection. The ureter beyond this large calculus is decompressed. The urinary bladder is unremarkable. RIGHT kidney with cortical scarring. Stomach/Bowel: Small hiatal hernia. Small bowel is normal caliber. Some fluid-filled loops throughout the abdomen, no signs of adjacent stranding. Liquid stool in the colon. Minimal diverticular changes in the sigmoid. Vascular/Lymphatic:  Calcified atheromatous plaque in the abdominal aorta. Smooth contour of the IVC. There is no gastrohepatic or hepatoduodenal ligament lymphadenopathy. No retroperitoneal or mesenteric lymphadenopathy. No pelvic sidewall lymphadenopathy. Reproductive: Post hysterectomy. Other: No free air.  No ascites. Musculoskeletal: No acute bone finding. No destructive bone process. Spinal degenerative changes. Osteopenia. IMPRESSION: 1. Large staghorn calculus in the LEFT  renal pelvis with dilation of upper pole calyces and contour abnormality with low attenuation in the upper pole. Contour abnormality could represent a cyst but is suspicious for small intrarenal collection and or developing XGP in the setting of large staghorn calculus with marked perinephric stranding and associated hydronephrosis. 2. Urologic consultation at or close follow-up may be warranted along with correlation with urine studies for further evaluation. 3. Cholelithiasis with large gallstone in the gallbladder measuring approximately 2.5 cm greatest dimension. 4. Small hiatal hernia. 5. Minimal diverticular changes in the sigmoid. 6. Aortic atherosclerosis. Aortic Atherosclerosis (ICD10-I70.0). Electronically Signed   By: Donzetta Kohut M.D.   On: 04/27/2021 16:35   DG Chest Portable 1 View  Result Date: 04/27/2021 CLINICAL DATA:  86 year old female with fever. EXAM: PORTABLE CHEST 1 VIEW COMPARISON:  None. FINDINGS: The cardiomediastinal silhouette is unremarkable. There is no evidence of focal airspace disease, pulmonary edema, suspicious pulmonary nodule/mass, pleural effusion, or pneumothorax. No acute bony abnormalities are identified. IMPRESSION: No active disease. Electronically Signed   By: Harmon Pier M.D.   On: 04/27/2021 14:09    I independently reviewed the above imaging studies.  Impression/Recommendation: Large staghorn calculus with probable upper pole obstruction.  I discussed case with interventional radiology.  I think it  would be very difficult to get a stent past the stone due to to the size of the stone and location extending down into the proximal ureter and encompassing the entire renal pelvis.  Interventional radiology to place left-sided nephrostomy tube for drainage.  Patient will ultimately likely need PCNL for definitive management of the stone.  For now obtain urine blood cultures continue on antibiotics and adjust accordingly.  IR for nephrostomy tube this morning.  We will follow.  [name] 04/28/2021, 7:23 AM     CC:

## 2021-04-28 NOTE — Progress Notes (Signed)
PROGRESS NOTE    Annette Vaughn  UJW:[id number] DOB: Oct 27, 1935 DOA: 04/27/2021 PCP: No primary care provider on file.    Brief Narrative:  [name] was admitted to the hospital with the working diagnosis of severe sepsis due to pyelonephritis in the setting of obstructive uropathy with staghorn calculus (present on admission).  86 year old female past medical history for hypertension, dyslipidemia and dementia who was brought to the hospital due to generalized weakness and dysuria.  Ongoing symptoms for last 3 to 4 weeks, with progressive generalized weakness, and left flank pain.  On her initial physical examination her temperature was 101 F, blood pressure 96/50, heart rate 106, respirate 19, oxygen saturation 94% on room air.  She had dry mucous membranes, lungs clear to auscultation bilaterally, heart S1-S2, present, rhythmic, abdomen soft, left costovertebral tenderness, no lower extremity edema.   Sodium 134, potassium 4.1, chloride 1 1, bicarb 29, glucose 110, BUN 29, creatinine 1.77, lactic acid 1.7, white count 26.0, hemoglobin 13.5, hematocrit 40.1, platelets 132. SARS COVID-19 negative.  Urinalysis specific gravity 1.014, > 300 protein, 21-50 red cells, >50 white cells. Blood culture positive for Klebsiella pneumonia.  Renal CT with large staghorn calculus in the left renal pelvis with dilatation of the upper pole calyces and contour abnormality with low attenuation in the upper lobe.  Suspected intrarenal collection in the setting of large staghorn calculus with marked perinephric stranding and associated hydronephrosis. Cholelithiasis with large gallstone.  Chest radiograph no infiltrates.  EKG 51 bpm, normal axis, normal intervals, sinus rhythm, poor R wave progression, no ST segment or T wave changes.   Assessment & Plan:   Principal Problem:   Acute pyelonephritis Active Problems:   Sepsis (HCC)   Staghorn calculus   AKI (acute kidney injury) (HCC)   Essential  hypertension   Hyperlipemia   Dementia without behavioral disturbance (HCC)   Hyponatremia   Cholelithiases   1. Severe sepsis due to left pyelonephritis in the setting of staghorn calculus/ obstructive uropathy, (end organ failure AKI). Present on admission.  Patient sp 10 F percutaneous nephrostomy placement into the left kidney.  Wbc continue to be elevated at 18,5. Blood culture is positive for Klebsiella Pneumonia.   Continue high dose ceftriaxone per protocol, continue to follow up on cell count, temperature curve and culture sensitivities.   2. AKI/ hyponatremia. renal function with serum cr at 1,32 with K at 3,7 and serum bicarbonate at 22. NA 136  Continue hydration with isotonic saline, follow up on renal function in am, avoid hypotension or nephrotoxic medications.   3. Dementia. Patient with confusion at her baseline, trying to pull IVs and drain. Will place on mittens and add as needed haldol. Her care giver is at he bedside, she has a POA but no close family.  Resume donepezil.   4. HTN/ dyslipidemia. Hold atenolol to prevent further bradycardia. Continue with statin therapy.    Patient continue to be at high risk for worsening sepsis.   Status is: Inpatient  Remains inpatient appropriate because:IV treatments appropriate due to intensity of illness or inability to take PO   Dispo: The patient is from: Home              Anticipated d/c is to: Home              Patient currently is not medically stable to d/c.   Difficult to place patient No   DVT prophylaxis: Enoxaparin   Code Status:   full  Family Communication:  Care  giver at the bedside       Consultants:   Urology   Procedures:   Left nephrostomy tube placement   Antimicrobials:   ceftriaxone    Subjective: Patient is confused but not agitated, no significant pain, no nausea or vomiting, no chest pain or dyspnea.   Objective: Vitals:   04/28/21 0905 04/28/21 0910 04/28/21 0928 04/28/21  1444  BP: (!) 131/55 (!) 127/50 (!) 138/48 105/82  Pulse: 65 70 64 (!) 59  Resp: 20 18 16 16   Temp:   99.1 F (37.3 C) 98.5 F (36.9 C)  TempSrc:   Oral Oral  SpO2: 94% 94% 92% 95%  Weight:        Intake/Output Summary (Last 24 hours) at 04/28/2021 1716 Last data filed at 04/28/2021 1400 Gross per 24 hour  Intake 2731.7 ml  Output --  Net 2731.7 ml   Filed Weights   04/27/21 1212  Weight: 61.2 kg    Examination:   General: Not in pain or dyspnea, deconditioned  Neurology: Awake and alert, non focal  E ENT: mild pallor, no icterus, oral mucosa moist Cardiovascular: No JVD. S1-S2 present, rhythmic, no gallops, rubs, or murmurs. No lower extremity edema. Pulmonary: positive breath sounds bilaterally,  no wheezing, rhonchi or rales. Gastrointestinal. Abdomen soft and non tender Skin. No rashes Musculoskeletal: no joint deformities Percutaneous nephrostomy tube in place.     Data Reviewed: I have personally reviewed following labs and imaging studies  CBC: Recent Labs  Lab 04/27/21 1242 04/28/21 0029  WBC 26.0* 18.5*  NEUTROABS 22.9*  --   HGB 13.5 12.2  HCT 40.1 36.4  MCV 92.2 93.1  PLT 132* 112*   Basic Metabolic Panel: Recent Labs  Lab 04/27/21 1242 04/28/21 0029  NA 134* 136  K 4.1 3.7  CL 101 109  CO2 29 22  GLUCOSE 110* 101*  BUN 29* 25*  CREATININE 1.77* 1.32*  CALCIUM 9.3 8.5*   GFR: CrCl cannot be calculated (Unknown ideal weight.). Liver Function Tests: Recent Labs  Lab 04/27/21 1242 04/28/21 0029  AST 19 20  ALT 10 12  ALKPHOS 63 53  BILITOT 1.7* 1.3*  PROT 7.1 5.6*  ALBUMIN 3.4* 2.7*   No results for input(s): LIPASE, AMYLASE in the last 168 hours. No results for input(s): AMMONIA in the last 168 hours. Coagulation Profile: Recent Labs  Lab 04/27/21 1313 04/28/21 0029  INR 1.2 1.2   Cardiac Enzymes: No results for input(s): CKTOTAL, CKMB, CKMBINDEX, TROPONINI in the last 168 hours. BNP (last 3 results) No results for  input(s): PROBNP in the last 8760 hours. HbA1C: No results for input(s): HGBA1C in the last 72 hours. CBG: No results for input(s): GLUCAP in the last 168 hours. Lipid Profile: No results for input(s): CHOL, HDL, LDLCALC, TRIG, CHOLHDL, LDLDIRECT in the last 72 hours. Thyroid Function Tests: No results for input(s): TSH, T4TOTAL, FREET4, T3FREE, THYROIDAB in the last 72 hours. Anemia Panel: No results for input(s): VITAMINB12, FOLATE, FERRITIN, TIBC, IRON, RETICCTPCT in the last 72 hours.    Radiology Studies: I have reviewed all of the imaging during this hospital visit personally     Scheduled Meds: . lidocaine       Continuous Infusions: . cefTRIAXone (ROCEPHIN)  IV 2 g (04/28/21 0845)  . lactated ringers 100 mL/hr at 04/28/21 1500     LOS: 1 day        Jordi Kamm 04/30/21, MD

## 2021-04-28 NOTE — Consult Note (Signed)
Chief Complaint: Patient was seen in consultation today for  Chief Complaint  Patient presents with  . Dysuria   at the request of Newsome,George B  Referring Physician(s): Newsome,George B  Patient Status: [facility] - In-pt  History of Present Illness: Annette Vaughn is a 86 y.o. female with dementia who presented to the emergency room last night with progressive dysuria, weakness and malaise.  She was found to be febrile and has a significant leukocytosis.  CT imaging demonstrates a large staghorn calculus in the lower pole and interpolar collecting system of the left kidney with hydronephrosis and the remaining interpolar and upper pole systems.  There is perinephric stranding.  The imaging findings are most consistent with an infected and obstructing staghorn calculus.  The patient was assessed by Dr. Benancio Deeds of urology.  Given her age, underlying comorbidities and the positioning of the stone, ureteral stent placement under anesthesia would be both risky and have a low chance of success.  Therefore, interventional radiology was consulted for cutaneous nephrostomy tube placement.  The patient is somewhat of a poor historian.  She is currently feeling well after some antibiotics and fluid support.  I was able to speak to her guardian and power of attorney over the telephone this morning.  History reviewed. No pertinent past medical history.  History reviewed. No pertinent surgical history.  Allergies: Patient has no known allergies.  Medications: Prior to Admission medications   Medication Sig Start Date End Date Taking? Authorizing Provider  atenolol (TENORMIN) 50 MG tablet Take 50 mg by mouth daily.   Yes [provider]  donepezil (ARICEPT) 5 MG tablet Take 5 mg by mouth at bedtime.   Yes [provider]  simvastatin (ZOCOR) 20 MG tablet Take 20 mg by mouth daily.   Yes [provider]  solifenacin (VESICARE) 5 MG tablet Take 5 mg by mouth daily.   Yes  [provider]     History reviewed. No pertinent family history.  Social History   Socioeconomic History  . Marital status: Divorced    Spouse name: Not on file  . Number of children: Not on file  . Years of education: Not on file  . Highest education level: Not on file  Occupational History  . Not on file  Tobacco Use  . Smoking status: Never Smoker  . Smokeless tobacco: Never Used  Substance and Sexual Activity  . Alcohol use: Not on file  . Drug use: Not on file  . Sexual activity: Not on file  Other Topics Concern  . Not on file  Social History Narrative  . Not on file   Social Determinants of Health   Financial Resource Strain: Not on file  Food Insecurity: Not on file  Transportation Needs: Not on file  Physical Activity: Not on file  Stress: Not on file  Social Connections: Not on file    Review of Systems: A 12 point ROS discussed and pertinent positives are indicated in the HPI above.  All other systems are negative.  Review of Systems  Vital Signs: BP (!) 148/67 (BP Location: Left Arm)   Pulse 79   Temp 98.2 F (36.8 C) (Oral)   Resp 15   Wt 61.2 kg   SpO2 95%   Physical Exam Vitals reviewed.  Constitutional:      Appearance: Normal appearance.  HENT:     Head: Normocephalic and atraumatic.  Eyes:     General: No scleral icterus. Cardiovascular:     Rate  and Rhythm: Normal rate.  Pulmonary:     Effort: Pulmonary effort is normal.  Skin:    General: Skin is warm and dry.  Neurological:     Mental Status: She is alert and oriented to person, place, and time.     Imaging: CT ABDOMEN PELVIS WO CONTRAST  Result Date: 04/27/2021 CLINICAL DATA:  Pain, acute nonlocalized abdominal pain in a 86 year old female. EXAM: CT ABDOMEN AND PELVIS WITHOUT CONTRAST TECHNIQUE: Multidetector CT imaging of the abdomen and pelvis was performed following the standard protocol without IV contrast. COMPARISON:  None FINDINGS: Lower chest: Basilar  atelectasis. No substantial pleural effusion. Signs of mitral annular and aortic valvular calcifications. Heart is incompletely imaged. Hepatobiliary: Liver with smooth contours.  Cholelithiasis. Pancreas: Pancreas with normal contour no sign of inflammation. Spleen: Normal size and contour. Adrenals/Urinary Tract: Adrenal glands are normal. Moderate LEFT upper pole hydronephrosis in the setting of large "staghorn type calculus at the LEFT UPJ extending into lower pole calices. This calculus measures 2.6 x 2.6 x 1.6 cm. There is moderate to marked LEFT-sided perinephric stranding. Small low-density area in the upper pole the LEFT kidney may represent a cyst or small intrarenal collection. The ureter beyond this large calculus is decompressed. The urinary bladder is unremarkable. RIGHT kidney with cortical scarring. Stomach/Bowel: Small hiatal hernia. Small bowel is normal caliber. Some fluid-filled loops throughout the abdomen, no signs of adjacent stranding. Liquid stool in the colon. Minimal diverticular changes in the sigmoid. Vascular/Lymphatic: Calcified atheromatous plaque in the abdominal aorta. Smooth contour of the IVC. There is no gastrohepatic or hepatoduodenal ligament lymphadenopathy. No retroperitoneal or mesenteric lymphadenopathy. No pelvic sidewall lymphadenopathy. Reproductive: Post hysterectomy. Other: No free air.  No ascites. Musculoskeletal: No acute bone finding. No destructive bone process. Spinal degenerative changes. Osteopenia. IMPRESSION: 1. Large staghorn calculus in the LEFT renal pelvis with dilation of upper pole calyces and contour abnormality with low attenuation in the upper pole. Contour abnormality could represent a cyst but is suspicious for small intrarenal collection and or developing XGP in the setting of large staghorn calculus with marked perinephric stranding and associated hydronephrosis. 2. Urologic consultation at or close follow-up may be warranted along with  correlation with urine studies for further evaluation. 3. Cholelithiasis with large gallstone in the gallbladder measuring approximately 2.5 cm greatest dimension. 4. Small hiatal hernia. 5. Minimal diverticular changes in the sigmoid. 6. Aortic atherosclerosis. Aortic Atherosclerosis (ICD10-I70.0). Electronically Signed   By: Donzetta Kohut M.D.   On: 04/27/2021 16:35   DG Chest Portable 1 View  Result Date: 04/27/2021 CLINICAL DATA:  86 year old female with fever. EXAM: PORTABLE CHEST 1 VIEW COMPARISON:  None. FINDINGS: The cardiomediastinal silhouette is unremarkable. There is no evidence of focal airspace disease, pulmonary edema, suspicious pulmonary nodule/mass, pleural effusion, or pneumothorax. No acute bony abnormalities are identified. IMPRESSION: No active disease. Electronically Signed   By: Harmon Pier M.D.   On: 04/27/2021 14:09    Labs:  CBC: Recent Labs    04/27/21 1242 04/28/21 0029  WBC 26.0* 18.5*  HGB 13.5 12.2  HCT 40.1 36.4  PLT 132* 112*    COAGS: Recent Labs    04/27/21 1313 04/28/21 0029  INR 1.2 1.2  APTT 30  --     BMP: Recent Labs    04/27/21 1242 04/28/21 0029  NA 134* 136  K 4.1 3.7  CL 101 109  CO2 29 22  GLUCOSE 110* 101*  BUN 29* 25*  CALCIUM 9.3 8.5*  CREATININE 1.77* 1.32*  GFRNONAA 28* 40*    LIVER FUNCTION TESTS: Recent Labs    04/27/21 1242 04/28/21 0029  BILITOT 1.7* 1.3*  AST 19 20  ALT 10 12  ALKPHOS 63 53  PROT 7.1 5.6*  ALBUMIN 3.4* 2.7*    TUMOR MARKERS: No results for input(s): AFPTM, CEA, CA199, CHROMGRNA in the last 8760 hours.  Assessment and Plan:  86 year old female currently admitted with urosepsis secondary to an infected and obstructing left-sided staghorn calculus.  She is improved clinically overnight with intravenous antibiotics and fluid support and is now a candidate for percutaneous nephrostomy tube placement.  I spoke to her guardian, Eliot Ford, over the telephone this morning and  explained the risks, benefits and alternatives to the procedure.  She voiced her understanding and desire to proceed with treatment.  1.)  Left percutaneous nephrostomy tube placement  Thank you for this interesting consult.  I greatly enjoyed meeting Ithzel Fedorchak and look forward to participating in their care.  A copy of this report was sent to the requesting provider on this date.  Electronically Signed: Sterling Big, MD 04/28/2021, 8:40 AM   I spent a total of 20 Minutes  in face to face in clinical consultation, greater than 50% of which was counseling/coordinating care for infected left

## 2021-04-29 LAB — CBC WITH DIFFERENTIAL/PLATELET
Abs Immature Granulocytes: 0.07 10*3/uL (ref 0.00–0.07)
Basophils Absolute: 0 10*3/uL (ref 0.0–0.1)
Basophils Relative: 0 %
Eosinophils Absolute: 0 10*3/uL (ref 0.0–0.5)
Eosinophils Relative: 0 %
HCT: 39.1 % (ref 36.0–46.0)
Hemoglobin: 13.1 g/dL (ref 12.0–15.0)
Immature Granulocytes: 1 %
Lymphocytes Relative: 6 %
Lymphs Abs: 0.8 10*3/uL (ref 0.7–4.0)
MCH: 30.7 pg (ref 26.0–34.0)
MCHC: 33.5 g/dL (ref 30.0–36.0)
MCV: 91.6 fL (ref 80.0–100.0)
Monocytes Absolute: 0.9 10*3/uL (ref 0.1–1.0)
Monocytes Relative: 7 %
Neutro Abs: 10.8 10*3/uL — ABNORMAL HIGH (ref 1.7–7.7)
Neutrophils Relative %: 86 %
Platelets: 126 10*3/uL — ABNORMAL LOW (ref 150–400)
RBC: 4.27 MIL/uL (ref 3.87–5.11)
RDW: 13.7 % (ref 11.5–15.5)
WBC: 12.6 10*3/uL — ABNORMAL HIGH (ref 4.0–10.5)
nRBC: 0 % (ref 0.0–0.2)

## 2021-04-29 LAB — URINE CULTURE: Culture: >=100000 — AB

## 2021-04-29 LAB — BASIC METABOLIC PANEL
Anion gap: 6 (ref 5–15)
BUN: 19 mg/dL (ref 8–23)
CO2: 23 mmol/L (ref 22–32)
Calcium: 8.6 mg/dL — ABNORMAL LOW (ref 8.9–10.3)
Chloride: 107 mmol/L (ref 98–111)
Creatinine, Ser: 1.26 mg/dL — ABNORMAL HIGH (ref 0.44–1.00)
GFR, Estimated: 42 mL/min — ABNORMAL LOW (ref >60)
Glucose, Bld: 103 mg/dL — ABNORMAL HIGH (ref 70–99)
Potassium: 3 mmol/L — ABNORMAL LOW (ref 3.5–5.1)
Sodium: 136 mmol/L (ref 135–145)

## 2021-04-29 MED ORDER — LORAZEPAM 2 MG/ML IJ SOLN
0.5000 mg | Freq: Once | INTRAMUSCULAR | Status: AC
  Administered 2021-04-29: 0.5 mg via INTRAMUSCULAR
  Filled 2021-04-29: qty 1

## 2021-04-29 MED ORDER — POTASSIUM CHLORIDE CRYS ER 20 MEQ PO TBCR
40.0000 meq | EXTENDED_RELEASE_TABLET | ORAL | Status: AC
  Administered 2021-04-29 (×2): 40 meq via ORAL
  Filled 2021-04-29 (×2): qty 2

## 2021-04-29 MED ORDER — LORAZEPAM 2 MG/ML IJ SOLN: 0.5000 mg | Freq: Once | INTRAMUSCULAR | Status: DC

## 2021-04-29 NOTE — Progress Notes (Signed)
Referring Physician(s): Newsome,G  Supervising Physician: Ruel Favors  Patient Status:  [facility] - In-pt  Chief Complaint:  Left renal stone, urosepsis, weakness, dysuria  Subjective: Pt sitting up in chair; currently asleep; caregiver in room; no reports of increased pain,N/V   Allergies: Patient has no known allergies.  Medications: Prior to Admission medications   Medication Sig Start Date End Date Taking? Authorizing Provider  acetaminophen (TYLENOL) 325 MG tablet Take 650 mg by mouth every 6 (six) hours as needed for mild pain.   Yes [provider]  atenolol (TENORMIN) 50 MG tablet Take 50 mg by mouth daily.   Yes [provider]  donepezil (ARICEPT) 10 MG tablet Take 10 mg by mouth at bedtime.   Yes [provider]  simvastatin (ZOCOR) 20 MG tablet Take 20 mg by mouth at bedtime.   Yes [provider]  solifenacin (VESICARE) 5 MG tablet Take 5 mg by mouth at bedtime.   Yes [provider]     Vital Signs: BP 136/71 (BP Location: Right Arm)   Pulse 72   Temp 99.8 F (37.7 C) (Oral)   Resp 14   Wt 135 lb (61.2 kg)   SpO2 92%   Physical Exam pt asleep; left PCN intact, insertion site ok, no leaking,NT; OP 200 cc yellow urine  Imaging: CT ABDOMEN PELVIS WO CONTRAST  Result Date: 04/27/2021 CLINICAL DATA:  Pain, acute nonlocalized abdominal pain in a 86 year old female. EXAM: CT ABDOMEN AND PELVIS WITHOUT CONTRAST TECHNIQUE: Multidetector CT imaging of the abdomen and pelvis was performed following the standard protocol without IV contrast. COMPARISON:  None FINDINGS: Lower chest: Basilar atelectasis. No substantial pleural effusion. Signs of mitral annular and aortic valvular calcifications. Heart is incompletely imaged. Hepatobiliary: Liver with smooth contours.  Cholelithiasis. Pancreas: Pancreas with normal contour no sign of inflammation. Spleen: Normal size and contour. Adrenals/Urinary Tract: Adrenal glands are  normal. Moderate LEFT upper pole hydronephrosis in the setting of large "staghorn type calculus at the LEFT UPJ extending into lower pole calices. This calculus measures 2.6 x 2.6 x 1.6 cm. There is moderate to marked LEFT-sided perinephric stranding. Small low-density area in the upper pole the LEFT kidney may represent a cyst or small intrarenal collection. The ureter beyond this large calculus is decompressed. The urinary bladder is unremarkable. RIGHT kidney with cortical scarring. Stomach/Bowel: Small hiatal hernia. Small bowel is normal caliber. Some fluid-filled loops throughout the abdomen, no signs of adjacent stranding. Liquid stool in the colon. Minimal diverticular changes in the sigmoid. Vascular/Lymphatic: Calcified atheromatous plaque in the abdominal aorta. Smooth contour of the IVC. There is no gastrohepatic or hepatoduodenal ligament lymphadenopathy. No retroperitoneal or mesenteric lymphadenopathy. No pelvic sidewall lymphadenopathy. Reproductive: Post hysterectomy. Other: No free air.  No ascites. Musculoskeletal: No acute bone finding. No destructive bone process. Spinal degenerative changes. Osteopenia. IMPRESSION: 1. Large staghorn calculus in the LEFT renal pelvis with dilation of upper pole calyces and contour abnormality with low attenuation in the upper pole. Contour abnormality could represent a cyst but is suspicious for small intrarenal collection and or developing XGP in the setting of large staghorn calculus with marked perinephric stranding and associated hydronephrosis. 2. Urologic consultation at or close follow-up may be warranted along with correlation with urine studies for further evaluation. 3. Cholelithiasis with large gallstone in the gallbladder measuring approximately 2.5 cm greatest dimension. 4. Small hiatal hernia. 5. Minimal diverticular changes in the sigmoid. 6. Aortic atherosclerosis. Aortic Atherosclerosis (ICD10-I70.0). Electronically Signed   By: Juliene Pina  Wile  M.D.   On: 04/27/2021 16:35   DG Chest Portable 1 View  Result Date: 04/27/2021 CLINICAL DATA:  86 year old female with fever. EXAM: PORTABLE CHEST 1 VIEW COMPARISON:  None. FINDINGS: The cardiomediastinal silhouette is unremarkable. There is no evidence of focal airspace disease, pulmonary edema, suspicious pulmonary nodule/mass, pleural effusion, or pneumothorax. No acute bony abnormalities are identified. IMPRESSION: No active disease. Electronically Signed   By: Harmon Pier M.D.   On: 04/27/2021 14:09   IR NEPHROSTOMY PLACEMENT LEFT  Result Date: 04/28/2021 INDICATION: 86 year old female with urosepsis secondary to an obstructing and infected staghorn within the left renal pelvis. She presents for percutaneous nephrostomy tube placement. EXAM: IR NEPHROSTOMY PLACEMENT LEFT COMPARISON:  None. MEDICATIONS: Rocephin 2 g IV ANESTHESIA/SEDATION: Fentanyl 25 mcg IV; Versed 1 mg IV Moderate Sedation Time:  12 minutes The patient was continuously monitored during the procedure by the interventional radiology nurse under my direct supervision. CONTRAST:  10mL OMNIPAQUE IOHEXOL 300 MG/ML SOLN - administered into the collecting system(s) FLUOROSCOPY TIME:  Fluoroscopy Time: 1 minutes 6 seconds (42 mGy). COMPLICATIONS: None immediate. TECHNIQUE: The procedure, risks, benefits, and alternatives were explained to the patient. Questions regarding the procedure were encouraged and answered. The patient understands and consents to the procedure. The left flank was prepped with chlorhexidine in a sterile fashion, and a sterile drape was applied covering the operative field. A sterile gown and sterile gloves were used for the procedure. Local anesthesia was provided with 1% Lidocaine. The left flank was interrogated with ultrasound and the left kidney identified. The kidney is hydronephrotic. A suitable access site on the skin overlying the lower pole, posterior calix was identified. After local mg anesthesia was achieved,  a small skin nick was made with an 11 blade scalpel. A 21 gauge Accustick needle was then advanced under direct sonographic guidance into the lower pole of the left kidney. A 0.018 inch wire was advanced under fluoroscopic guidance into the left renal collecting system. The Accustick sheath was then advanced over the wire and a 0.018 system exchanged for a 0.035 system. Gentle hand injection of contrast material confirms placement of the sheath within the renal collecting system. There is moderate hydronephrosis and a large staghorn calculus primarily involving the renal pelvis and the lower pole and interpolar infundibulum. The tract from the scan into the renal collecting system was then dilated serially to 10-French. A 10-French Cook all-purpose drain was then placed and positioned under fluoroscopic guidance. The locking loop is well formed within the upper pole infundibulum in the most dilated portion of the collecting system. The catheter was secured to the skin with 2-0 Prolene and a sterile bandage was placed. Catheter was left to gravity bag drainage. IMPRESSION: Successful placement of a left 10 French percutaneous nephrostomy tube. Electronically Signed   By: Malachy Moan M.D.   On: 04/28/2021 10:07    Labs:  CBC: Recent Labs    04/27/21 1242 04/28/21 0029 04/29/21 0610  WBC 26.0* 18.5* 12.6*  HGB 13.5 12.2 13.1  HCT 40.1 36.4 39.1  PLT 132* 112* 126*    COAGS: Recent Labs    04/27/21 1313 04/28/21 0029  INR 1.2 1.2  APTT 30  --     BMP: Recent Labs    04/27/21 1242 04/28/21 0029 04/29/21 0610  NA 134* 136 136  K 4.1 3.7 3.0*  CL 101 109 107  CO2 29 22 23   GLUCOSE 110* 101* 103*  BUN 29* 25* 19  CALCIUM 9.3 8.5*  8.6*  CREATININE 1.77* 1.32* 1.26*  GFRNONAA 28* 40* 42*    LIVER FUNCTION TESTS: Recent Labs    04/27/21 1242 04/28/21 0029  BILITOT 1.7* 1.3*  AST 19 20  ALT 10 12  ALKPHOS 63 53  PROT 7.1 5.6*  ALBUMIN 3.4* 2.7*    Assessment and  Plan: Patient with history of urosepsis secondary to obstructing and infected staghorn stone within the left renal pelvis, status post left nephrostomy on 5/22; temp 99.8, WBC 12.6 down from 18.5, hemoglobin stable, potassium 3- replace; creatinine 1.26 down from 1.32; urine cultures pending; antibiotics/further plans as per primary/urology, continue hydration   Electronically Signed: D. Jeananne Rama, PA-C 04/29/2021, 12:39 PM   I spent a total of 15 minutes at the the patient's bedside AND on the patient's hospital floor or unit, greater than 50% of which was counseling/coordinating care for left nephrostomy    Patient ID: Annette Vaughn, female   DOB: Sep 09, 1935, 86 y.o.   MRN: [id number]

## 2021-04-29 NOTE — Progress Notes (Signed)
Patient personal care giver left about 130 am, without informing pt's primary nurse or charge nurse. However, she told nurse tech. Patient POA Miss Norina Buzzard S was informed about the situation.

## 2021-04-29 NOTE — Progress Notes (Signed)
Subjective: `Patient sleeping this PM, nurse states she seems less confused, not pulling at NT as much.Afebrile CR 1.26. WBC 12.6 Blood cx-Klebsiella  Objective: Vital signs in last 24 hours: Temp:  [97.9 F (36.6 C)-100.2 F (37.9 C)] 97.9 F (36.6 C) (05/23 1414) Pulse Rate:  [71-101] 101 (05/23 1414) Resp:  [14-16] 16 (05/23 1414) BP: (124-136)/(46-71) 124/46 (05/23 1414) SpO2:  [91 %-92 %] 91 % (05/23 1414)  Intake/Output from previous day: 05/22 0701 - 05/23 0700 In: 2280 [P.O.:1320; I.V.:860; IV Piggyback:100] Out: 552 [Urine:550; Stool:2] Intake/Output this shift: Total I/O In: 480 [P.O.:480] Out: 700 [Urine:600; Stool:100]  Physical Exam:   Lab Results: Recent Labs    04/27/21 1242 04/28/21 0029 04/29/21 0610  HGB 13.5 12.2 13.1  HCT 40.1 36.4 39.1   BMET Recent Labs    04/28/21 0029 04/29/21 0610  NA 136 136  K 3.7 3.0*  CL 109 107  CO2 22 23  GLUCOSE 101* 103*  BUN 25* 19  CREATININE 1.32* 1.26*  CALCIUM 8.5* 8.6*     Studies/Results: IR NEPHROSTOMY PLACEMENT LEFT  Result Date: 04/28/2021 INDICATION: 86 year old female with urosepsis secondary to an obstructing and infected staghorn within the left renal pelvis. She presents for percutaneous nephrostomy tube placement. EXAM: IR NEPHROSTOMY PLACEMENT LEFT COMPARISON:  None. MEDICATIONS: Rocephin 2 g IV ANESTHESIA/SEDATION: Fentanyl 25 mcg IV; Versed 1 mg IV Moderate Sedation Time:  12 minutes The patient was continuously monitored during the procedure by the interventional radiology nurse under my direct supervision. CONTRAST:  6mL OMNIPAQUE IOHEXOL 300 MG/ML SOLN - administered into the collecting system(s) FLUOROSCOPY TIME:  Fluoroscopy Time: 1 minutes 6 seconds (42 mGy). COMPLICATIONS: None immediate. TECHNIQUE: The procedure, risks, benefits, and alternatives were explained to the patient. Questions regarding the procedure were encouraged and answered. The patient understands and consents to the  procedure. The left flank was prepped with chlorhexidine in a sterile fashion, and a sterile drape was applied covering the operative field. A sterile gown and sterile gloves were used for the procedure. Local anesthesia was provided with 1% Lidocaine. The left flank was interrogated with ultrasound and the left kidney identified. The kidney is hydronephrotic. A suitable access site on the skin overlying the lower pole, posterior calix was identified. After local mg anesthesia was achieved, a small skin nick was made with an 11 blade scalpel. A 21 gauge Accustick needle was then advanced under direct sonographic guidance into the lower pole of the left kidney. A 0.018 inch wire was advanced under fluoroscopic guidance into the left renal collecting system. The Accustick sheath was then advanced over the wire and a 0.018 system exchanged for a 0.035 system. Gentle hand injection of contrast material confirms placement of the sheath within the renal collecting system. There is moderate hydronephrosis and a large staghorn calculus primarily involving the renal pelvis and the lower pole and interpolar infundibulum. The tract from the scan into the renal collecting system was then dilated serially to 10-French. A 10-French Cook all-purpose drain was then placed and positioned under fluoroscopic guidance. The locking loop is well formed within the upper pole infundibulum in the most dilated portion of the collecting system. The catheter was secured to the skin with 2-0 Prolene and a sterile bandage was placed. Catheter was left to gravity bag drainage. IMPRESSION: Successful placement of a left 10 French percutaneous nephrostomy tube. Electronically Signed   By: Malachy Moan M.D.   On: 04/28/2021 10:07    Assessment/Plan:Left staghorn calculus/urosepsis-s/p Left nephrostomy Plan/Rec: Continue  Abx, will need PCNL after  2 weeks antibiotics    LOS: 2 days   [name] 04/29/2021, 4:38 PM

## 2021-04-29 NOTE — Progress Notes (Signed)
PROGRESS NOTE    Annette Vaughn  FIE:[id number] DOB: 09/18/1935 DOA: 04/27/2021 PCP: No primary care provider on file.    Brief Narrative:  Sepsis due to pyelonephritis in the setting of obstructive uropathy with staghorn calculus (present on admission).  86 year old female, former school principal, with past medical history for hypertension, dyslipidemia and dementia who was brought to the hospital due to generalized weakness and dysuria.  Ongoing symptoms for last 3 to 4 weeks, with progressive generalized weakness, and left flank pain.  On her initial physical examination her temperature was 101 F, blood pressure 96/50, heart rate 106, respirate 19, oxygen saturation 94% on room air.  She had dry mucous membranes, lungs clear to auscultation bilaterally, heart S1-S2, present, rhythmic, abdomen soft, left costovertebral tenderness, no lower extremity edema.   Sodium 134, potassium 4.1, chloride 1 1, bicarb 29, glucose 110, BUN 29, creatinine 1.77, lactic acid 1.7, white count 26.0, hemoglobin 13.5, hematocrit 40.1, platelets 132. SARS COVID-19 negative.  Urinalysis specific gravity 1.014, > 300 protein, 21-50 red cells, >50 white cells. Blood culture positive for Klebsiella pneumonia.  Renal CT with large staghorn calculus in the left renal pelvis with dilatation of the upper pole calyces and contour abnormality with low attenuation in the upper lobe.  Suspected intrarenal collection in the setting of large staghorn calculus with marked perinephric stranding and associated hydronephrosis. Cholelithiasis with large gallstone.  Chest radiograph no infiltrates.  EKG 51 bpm, normal axis, normal intervals, sinus rhythm, poor R wave progression, no ST segment or T wave changes.     Assessment & Plan:   Principal Problem:   Acute pyelonephritis Active Problems:   Sepsis (HCC)   Staghorn calculus   AKI (acute kidney injury) (HCC)   Essential hypertension   Hyperlipemia    Dementia without behavioral disturbance (HCC)   Hyponatremia   Cholelithiases    1. Severe sepsis due to left pyelonephritis in the setting of staghorn calculus/ obstructive uropathy, (end organ failure AKI). Present on admission.  Patient sp 10 F percutaneous nephrostomy placement into the left kidney.  Blood culture is positive for Klebsiella Pneumonia.  Wbc today is 12,6 from 18,5   Tolerating well high dose ceftriaxone per protocol. Follow with sensitives.  Per Urology she will need eventually percutaneous nephrolithonomy for definitive management of the stone.   2. AKI/ hyponatremia. Serum cr is 1,26, K is down to 30 and serum bicarbonate at 19 with anion gap of 6. CL 107.   I suspect cr now is close to baseline, will hold on IV fluids for now and will follow renal function in am. Continue to encourage po intake, avoid hypotension and nephrotoxic medications.  Add 80 meq Kcl in 2 divided doses.   3. Dementia, complicated with delirium/ metabolic encephalopathy.  Patient very agitated last night, received IV haldol and IM lorazepam. This am is somnolent seating in the chair. Sitter is at the bedside.   Continue neuro checks per unit protocol, fall and aspiration precautions. Physical restrains and as needed haldol.    4. HTN/ dyslipidemia. Continue to hold on atenolol and continue with statin therapy.     Patient continue to be at high risk for worsening sepsis.   Status is: Inpatient  Remains inpatient appropriate because:IV treatments appropriate due to intensity of illness or inability to take PO   Dispo: The patient is from: ALF              Anticipated d/c is to: ALF  Patient currently is not medically stable to d/c.   Difficult to place patient No   DVT prophylaxis: Enoxaparin   Code Status:   FULL   Family Communication:   I spoke with patient's cere give (POA) at the bedside, we talked in detail about patient's condition, plan of care and  prognosis and all questions were addressed.      Consultants:   Urology   IR  Procedures:   Nephrostomy tube placement   Antimicrobials:   Ceftriaxone     Subjective: Patient has been very agitated last night received lorazepam and haldol, at the time of my examination is somnolent.   Objective: Vitals:   04/28/21 0928 04/28/21 1444 04/28/21 2210 04/29/21 0559  BP: (!) 138/48 105/82 132/61 136/71  Pulse: 64 (!) 59 71 72  Resp: 16 16 16 14   Temp: 99.1 F (37.3 C) 98.5 F (36.9 C) 98.9 F (37.2 C) 99.8 F (37.7 C)  TempSrc: Oral Oral Oral Oral  SpO2: 92% 95% 91% 92%  Weight:        Intake/Output Summary (Last 24 hours) at 04/29/2021 1347 Last data filed at 04/29/2021 1117 Gross per 24 hour  Intake 2240 ml  Output 1252 ml  Net 988 ml   Filed Weights   04/27/21 1212  Weight: 61.2 kg    Examination:   General: Not in pain or dyspnea, deconditioned  Neurology: somnolent.  E ENT: mild pallor, no icterus, oral mucosa moist Cardiovascular: No JVD. S1-S2 present, rhythmic, no gallops, rubs, or murmurs. No lower extremity edema. Pulmonary: positive breath sounds bilaterally, adequate air movement, no wheezing, rhonchi or rales. Gastrointestinal. Abdomen soft and non tender Skin. No rashes Musculoskeletal: no joint deformities     Data Reviewed: I have personally reviewed following labs and imaging studies  CBC: Recent Labs  Lab 04/27/21 1242 04/28/21 0029 04/29/21 0610  WBC 26.0* 18.5* 12.6*  NEUTROABS 22.9*  --  10.8*  HGB 13.5 12.2 13.1  HCT 40.1 36.4 39.1  MCV 92.2 93.1 91.6  PLT 132* 112* 126*   Basic Metabolic Panel: Recent Labs  Lab 04/27/21 1242 04/28/21 0029 04/29/21 0610  NA 134* 136 136  K 4.1 3.7 3.0*  CL 101 109 107  CO2 29 22 23   GLUCOSE 110* 101* 103*  BUN 29* 25* 19  CREATININE 1.77* 1.32* 1.26*  CALCIUM 9.3 8.5* 8.6*   GFR: CrCl cannot be calculated (Unknown ideal weight.). Liver Function Tests: Recent Labs  Lab  04/27/21 1242 04/28/21 0029  AST 19 20  ALT 10 12  ALKPHOS 63 53  BILITOT 1.7* 1.3*  PROT 7.1 5.6*  ALBUMIN 3.4* 2.7*   No results for input(s): LIPASE, AMYLASE in the last 168 hours. No results for input(s): AMMONIA in the last 168 hours. Coagulation Profile: Recent Labs  Lab 04/27/21 1313 04/28/21 0029  INR 1.2 1.2   Cardiac Enzymes: No results for input(s): CKTOTAL, CKMB, CKMBINDEX, TROPONINI in the last 168 hours. BNP (last 3 results) No results for input(s): PROBNP in the last 8760 hours. HbA1C: No results for input(s): HGBA1C in the last 72 hours. CBG: No results for input(s): GLUCAP in the last 168 hours. Lipid Profile: No results for input(s): CHOL, HDL, LDLCALC, TRIG, CHOLHDL, LDLDIRECT in the last 72 hours. Thyroid Function Tests: No results for input(s): TSH, T4TOTAL, FREET4, T3FREE, THYROIDAB in the last 72 hours. Anemia Panel: No results for input(s): VITAMINB12, FOLATE, FERRITIN, TIBC, IRON, RETICCTPCT in the last 72 hours.    Radiology Studies: I have  reviewed all of the imaging during this hospital visit personally     Scheduled Meds: . darifenacin  7.5 mg Oral Daily  . donepezil  10 mg Oral QHS  . simvastatin  20 mg Oral q1800   Continuous Infusions: . cefTRIAXone (ROCEPHIN)  IV 2 g (04/29/21 1021)  . dextrose 5 % and 0.9% NaCl 75 mL/hr at 04/29/21 0814     LOS: 2 days        Berdine Rasmusson Annett Gula,

## 2021-04-29 NOTE — TOC Initial Note (Signed)
Transition of Care [facility]) - Initial/Assessment Note    Patient Details  Name: Annette Vaughn MRN: [id number] Date of Birth: 1935-07-29  Transition of [facility]) CM/SW Contact:    Bartholome Bill, RN Phone Number: 04/29/2021, 2:11 PM  Clinical Narrative:                  Pt is from Independent Living at [facility]. She will return there at DC.     Activities of Daily Living Home Assistive Devices/Equipment: None ADL Screening (condition at time of admission) Patient's cognitive ability adequate to safely complete daily activities?: No Is the patient deaf or have difficulty hearing?: Yes Does the patient have difficulty seeing, even when wearing glasses/contacts?: Yes Does the patient have difficulty concentrating, remembering, or making decisions?: Yes Patient able to express need for assistance with ADLs?: Yes Does the patient have difficulty dressing or bathing?: Yes Independently performs ADLs?: No Communication: Independent Does the patient have difficulty walking or climbing stairs?: No Weakness of Legs: Both Weakness of Arms/Hands: None  Permission Sought/Granted                  Emotional Assessment              Admission diagnosis:  Sepsis (HCC) [A41.9] Sepsis, due to unspecified organism, unspecified whether acute organ dysfunction present [facility]) [A41.9] Patient Active Problem List   Diagnosis Date Noted  . Sepsis (HCC) 04/27/2021  . Staghorn calculus 04/27/2021  . Acute pyelonephritis 04/27/2021  . AKI (acute kidney injury) (HCC) 04/27/2021  . Essential hypertension 04/27/2021  . Hyperlipemia 04/27/2021  . Dementia without behavioral disturbance (HCC) 04/27/2021  . Hyponatremia 04/27/2021  . Thrombocytopenia (HCC) 04/27/2021  . Cholelithiases 04/27/2021   PCP:  No primary care provider on file. Pharmacy:  No Pharmacies Listed    Social Determinants of Health (SDOH) Interventions    Readmission Risk Interventions No flowsheet data  found.

## 2021-04-30 LAB — CBC WITH DIFFERENTIAL/PLATELET
Abs Immature Granulocytes: 0.08 10*3/uL — ABNORMAL HIGH (ref 0.00–0.07)
Basophils Absolute: 0 10*3/uL (ref 0.0–0.1)
Basophils Relative: 0 %
Eosinophils Absolute: 0.1 10*3/uL (ref 0.0–0.5)
Eosinophils Relative: 1 %
HCT: 39.9 % (ref 36.0–46.0)
Hemoglobin: 13.3 g/dL (ref 12.0–15.0)
Immature Granulocytes: 1 %
Lymphocytes Relative: 9 %
Lymphs Abs: 1.1 10*3/uL (ref 0.7–4.0)
MCH: 30.8 pg (ref 26.0–34.0)
MCHC: 33.3 g/dL (ref 30.0–36.0)
MCV: 92.4 fL (ref 80.0–100.0)
Monocytes Absolute: 1.5 10*3/uL — ABNORMAL HIGH (ref 0.1–1.0)
Monocytes Relative: 12 %
Neutro Abs: 9.4 10*3/uL — ABNORMAL HIGH (ref 1.7–7.7)
Neutrophils Relative %: 77 %
Platelets: 122 10*3/uL — ABNORMAL LOW (ref 150–400)
RBC: 4.32 MIL/uL (ref 3.87–5.11)
RDW: 13.9 % (ref 11.5–15.5)
WBC: 12.3 10*3/uL — ABNORMAL HIGH (ref 4.0–10.5)
nRBC: 0 % (ref 0.0–0.2)

## 2021-04-30 LAB — CULTURE, BLOOD (ROUTINE X 2)

## 2021-04-30 LAB — BASIC METABOLIC PANEL
Anion gap: 9 (ref 5–15)
BUN: 12 mg/dL (ref 8–23)
CO2: 20 mmol/L — ABNORMAL LOW (ref 22–32)
Calcium: 8.7 mg/dL — ABNORMAL LOW (ref 8.9–10.3)
Chloride: 107 mmol/L (ref 98–111)
Creatinine, Ser: 1.24 mg/dL — ABNORMAL HIGH (ref 0.44–1.00)
GFR, Estimated: 43 mL/min — ABNORMAL LOW (ref >60)
Glucose, Bld: 94 mg/dL (ref 70–99)
Potassium: 3.7 mmol/L (ref 3.5–5.1)
Sodium: 136 mmol/L (ref 135–145)

## 2021-04-30 MED ORDER — CEFAZOLIN SODIUM-DEXTROSE 2-4 GM/100ML-% IV SOLN: 2.0000 g | Freq: Three times a day (TID) | INTRAVENOUS | Status: DC

## 2021-04-30 MED ORDER — SODIUM CHLORIDE 0.9 % IV SOLN
2.0000 g | INTRAVENOUS | Status: DC
  Administered 2021-05-01 – 2021-05-02 (×2): 2 g via INTRAVENOUS
  Filled 2021-04-30 (×3): qty 20

## 2021-04-30 NOTE — Progress Notes (Signed)
Referring Physician(s): Newsome,G  Supervising Physician: Dr. Lowella Dandy  Patient Status:  [facility] - In-pt  Chief Complaint:  Left renal stone, urosepsis, weakness, dysuria  Subjective: Pt sleeping in bed. Arouses easily. No complaints today   Allergies: Patient has no known allergies.  Medications:  Current Facility-Administered Medications:  .  acetaminophen (TYLENOL) tablet 650 mg, 650 mg, Oral, Q6H PRN, 650 mg at 04/28/21 0128 **OR** acetaminophen (TYLENOL) suppository 650 mg, 650 mg, Rectal, Q6H PRN, Rometta Emery, MD .  Melene Muller ON 05/01/2021] cefTRIAXone (ROCEPHIN) 2 g in sodium chloride 0.9 % 100 mL IVPB, 2 g, Intravenous, Q24H, Arrien, York Ram, MD .  darifenacin (ENABLEX) 24 hr tablet 7.5 mg, 7.5 mg, Oral, Daily, Arrien, York Ram, MD, 7.5 mg at 04/30/21 1026 .  donepezil (ARICEPT) tablet 10 mg, 10 mg, Oral, QHS, Arrien, York Ram, MD, 10 mg at 04/29/21 2224 .  haloperidol (HALDOL) tablet 1 mg, 1 mg, Oral, Q6H PRN, 1 mg at 04/29/21 1017 **OR** haloperidol lactate (HALDOL) injection 1 mg, 1 mg, Intramuscular, Q6H PRN, Arrien, York Ram, MD, 1 mg at 04/28/21 1430 .  ondansetron (ZOFRAN) tablet 4 mg, 4 mg, Oral, Q6H PRN **OR** ondansetron (ZOFRAN) injection 4 mg, 4 mg, Intravenous, Q6H PRN, Mikeal Hawthorne, Mohammad L, MD .  simvastatin (ZOCOR) tablet 20 mg, 20 mg, Oral, q1800, Arrien, York Ram, MD, 20 mg at 04/29/21 1707    Vital Signs: BP (!) 155/70 (BP Location: Right Arm)   Pulse 60   Temp 99.7 F (37.6 C) (Oral)   Resp 18   Wt 61.2 kg   SpO2 100%   Physical Exam  (L)PCN intact, site clean, NT Clear UOP in bag.  Imaging: CT ABDOMEN PELVIS WO CONTRAST  Result Date: 04/27/2021 CLINICAL DATA:  Pain, acute nonlocalized abdominal pain in a 86 year old female. EXAM: CT ABDOMEN AND PELVIS WITHOUT CONTRAST TECHNIQUE: Multidetector CT imaging of the abdomen and pelvis was performed following the standard protocol without IV contrast. COMPARISON:   None FINDINGS: Lower chest: Basilar atelectasis. No substantial pleural effusion. Signs of mitral annular and aortic valvular calcifications. Heart is incompletely imaged. Hepatobiliary: Liver with smooth contours.  Cholelithiasis. Pancreas: Pancreas with normal contour no sign of inflammation. Spleen: Normal size and contour. Adrenals/Urinary Tract: Adrenal glands are normal. Moderate LEFT upper pole hydronephrosis in the setting of large "staghorn type calculus at the LEFT UPJ extending into lower pole calices. This calculus measures 2.6 x 2.6 x 1.6 cm. There is moderate to marked LEFT-sided perinephric stranding. Small low-density area in the upper pole the LEFT kidney may represent a cyst or small intrarenal collection. The ureter beyond this large calculus is decompressed. The urinary bladder is unremarkable. RIGHT kidney with cortical scarring. Stomach/Bowel: Small hiatal hernia. Small bowel is normal caliber. Some fluid-filled loops throughout the abdomen, no signs of adjacent stranding. Liquid stool in the colon. Minimal diverticular changes in the sigmoid. Vascular/Lymphatic: Calcified atheromatous plaque in the abdominal aorta. Smooth contour of the IVC. There is no gastrohepatic or hepatoduodenal ligament lymphadenopathy. No retroperitoneal or mesenteric lymphadenopathy. No pelvic sidewall lymphadenopathy. Reproductive: Post hysterectomy. Other: No free air.  No ascites. Musculoskeletal: No acute bone finding. No destructive bone process. Spinal degenerative changes. Osteopenia. IMPRESSION: 1. Large staghorn calculus in the LEFT renal pelvis with dilation of upper pole calyces and contour abnormality with low attenuation in the upper pole. Contour abnormality could represent a cyst but is suspicious for small intrarenal collection and or developing XGP in the setting of large staghorn calculus with marked perinephric  stranding and associated hydronephrosis. 2. Urologic consultation at or close follow-up  may be warranted along with correlation with urine studies for further evaluation. 3. Cholelithiasis with large gallstone in the gallbladder measuring approximately 2.5 cm greatest dimension. 4. Small hiatal hernia. 5. Minimal diverticular changes in the sigmoid. 6. Aortic atherosclerosis. Aortic Atherosclerosis (ICD10-I70.0). Electronically Signed   By: Donzetta Kohut M.D.   On: 04/27/2021 16:35   DG Chest Portable 1 View  Result Date: 04/27/2021 CLINICAL DATA:  86 year old female with fever. EXAM: PORTABLE CHEST 1 VIEW COMPARISON:  None. FINDINGS: The cardiomediastinal silhouette is unremarkable. There is no evidence of focal airspace disease, pulmonary edema, suspicious pulmonary nodule/mass, pleural effusion, or pneumothorax. No acute bony abnormalities are identified. IMPRESSION: No active disease. Electronically Signed   By: Harmon Pier M.D.   On: 04/27/2021 14:09   IR NEPHROSTOMY PLACEMENT LEFT  Result Date: 04/28/2021 INDICATION: 86 year old female with urosepsis secondary to an obstructing and infected staghorn within the left renal pelvis. She presents for percutaneous nephrostomy tube placement. EXAM: IR NEPHROSTOMY PLACEMENT LEFT COMPARISON:  None. MEDICATIONS: Rocephin 2 g IV ANESTHESIA/SEDATION: Fentanyl 25 mcg IV; Versed 1 mg IV Moderate Sedation Time:  12 minutes The patient was continuously monitored during the procedure by the interventional radiology nurse under my direct supervision. CONTRAST:  30mL OMNIPAQUE IOHEXOL 300 MG/ML SOLN - administered into the collecting system(s) FLUOROSCOPY TIME:  Fluoroscopy Time: 1 minutes 6 seconds (42 mGy). COMPLICATIONS: None immediate. TECHNIQUE: The procedure, risks, benefits, and alternatives were explained to the patient. Questions regarding the procedure were encouraged and answered. The patient understands and consents to the procedure. The left flank was prepped with chlorhexidine in a sterile fashion, and a sterile drape was applied covering the  operative field. A sterile gown and sterile gloves were used for the procedure. Local anesthesia was provided with 1% Lidocaine. The left flank was interrogated with ultrasound and the left kidney identified. The kidney is hydronephrotic. A suitable access site on the skin overlying the lower pole, posterior calix was identified. After local mg anesthesia was achieved, a small skin nick was made with an 11 blade scalpel. A 21 gauge Accustick needle was then advanced under direct sonographic guidance into the lower pole of the left kidney. A 0.018 inch wire was advanced under fluoroscopic guidance into the left renal collecting system. The Accustick sheath was then advanced over the wire and a 0.018 system exchanged for a 0.035 system. Gentle hand injection of contrast material confirms placement of the sheath within the renal collecting system. There is moderate hydronephrosis and a large staghorn calculus primarily involving the renal pelvis and the lower pole and interpolar infundibulum. The tract from the scan into the renal collecting system was then dilated serially to 10-French. A 10-French Cook all-purpose drain was then placed and positioned under fluoroscopic guidance. The locking loop is well formed within the upper pole infundibulum in the most dilated portion of the collecting system. The catheter was secured to the skin with 2-0 Prolene and a sterile bandage was placed. Catheter was left to gravity bag drainage. IMPRESSION: Successful placement of a left 10 French percutaneous nephrostomy tube. Electronically Signed   By: Malachy Moan M.D.   On: 04/28/2021 10:07    Labs:  CBC: Recent Labs    04/27/21 1242 04/28/21 0029 04/29/21 0610 04/30/21 0526  WBC 26.0* 18.5* 12.6* 12.3*  HGB 13.5 12.2 13.1 13.3  HCT 40.1 36.4 39.1 39.9  PLT 132* 112* 126* 122*    COAGS: Recent  Labs    04/27/21 1313 04/28/21 0029  INR 1.2 1.2  APTT 30  --     BMP: Recent Labs    04/27/21 1242  04/28/21 0029 04/29/21 0610 04/30/21 0526  NA 134* 136 136 136  K 4.1 3.7 3.0* 3.7  CL 101 109 107 107  CO2 29 22 23  20*  GLUCOSE 110* 101* 103* 94  BUN 29* 25* 19 12  CALCIUM 9.3 8.5* 8.6* 8.7*  CREATININE 1.77* 1.32* 1.26* 1.24*  GFRNONAA 28* 40* 42* 43*    LIVER FUNCTION TESTS: Recent Labs    04/27/21 1242 04/28/21 0029  BILITOT 1.7* 1.3*  AST 19 20  ALT 10 12  ALKPHOS 63 53  PROT 7.1 5.6*  ALBUMIN 3.4* 2.7*    Assessment and Plan: Patient with history of urosepsis secondary to obstructing and infected staghorn stone within the left renal pelvis, status post left nephrostomy on 5/22 WBC and CR cont to trend down. Antibiotics/further plans as per primary/urology, continue hydration   Electronically Signed: 6/22, PA-C 04/30/2021, 11:58 AM   I spent a total of 15 minutes at the the patient's bedside AND on the patient's hospital floor or unit, greater than 50% of which was counseling/coordinating care for left nephrostomy

## 2021-04-30 NOTE — Progress Notes (Addendum)
PROGRESS NOTE    Annette Vaughn  ZOX:[id number] DOB: January 26, 1935 DOA: 04/27/2021 PCP: No primary care provider on file.    Brief Narrative:  Annette Vaughn was admitted to the hospital with the working diagnosis of sepsis due to left pyelonephritis in the setting of obstructive uropathy with staghorn calculus(present on admission).  86 year old female, former Runner, broadcasting/film/video and school principal, with the past medical history for hypertension, dyslipidemia and dementia who was brought to the hospital due to generalized weakness and dysuria. Ongoing symptoms for last 3 to 4 weeks, with progressive generalized weakness, and left flank pain.On her initial physical examination her temperature was 101 F, blood pressure 96/50, heart rate 106, respiratory rate 19, oxygen saturation 94% on room air. She had dry mucous membranes, lungs clear to auscultation bilaterally, heart S1-S2, present, rhythmic, abdomen soft, left costovertebral tenderness, no lower extremity edema.  Sodium 134, potassium 4.1, chloride 1 1, bicarb 29, glucose 110, BUN 29, creatinine 1.77, lactic acid 1.7, white count 26.0, hemoglobin 13.5, hematocrit 40.1, platelets 132. SARS COVID-19 negative.  Urinalysis specific gravity 1.014,>300 protein, 21-50 red cells, >50white cells.  Renal CT with large staghorn calculus in the left renal pelvis with dilatation of the upper pole calyces and contour abnormality with low attenuation in the upper lobe. Suspected intrarenal collection in the setting of large staghorn calculus with marked perinephric stranding and associated hydronephrosis. Cholelithiasis with large gallstone.  Chest radiograph no infiltrates.  EKG 51 bpm, normal axis, normal intervals, sinus rhythm, poor R wave progression, no ST segment or T wave changes.  Patient was placed on IV fluids and IV antibiotic therapy. Urology was consulted with recommendations for IR placement of renal drain.  05/22 underwent 10 french  percutaneous nephrostomy to the left kidney with good toleration.    Blood cultures positive for Klebsiella Pneumonia sensitive to cephalosporins.   She had agitation and metabolic encephalopathy during her hospitalization.   Plan to return to ALF where she has care givers. She has no close family, a POA helps with medical decisions.   Will need out patient percutaneous nephrolithonomy for definitive management of the stone, per Urology.   Assessment & Plan:   Principal Problem:   Acute pyelonephritis Active Problems:   Sepsis (HCC)   Staghorn calculus   AKI (acute kidney injury) (HCC)   Essential hypertension   Hyperlipemia   Dementia without behavioral disturbance (HCC)   Hyponatremia   Cholelithiases   1. Severe sepsis due to left pyelonephritis in the setting of staghorn calculus/ obstructive uropathy, (end organ failure AKI). Present on admission.  Patient sp 10 F percutaneous nephrostomy placement into the left kidney.  Blood culture is positive for Klebsiella Pneumonia sensitive to cephalosporins.  Wbc continue to be elevated at 12,3 from 12.6. She has remained afebrile.   Plan to continue with high dose ceftriaxone due to high MIC (16) to cefazolin.  At discharge to consider oral flouroquinolone to complete 2 week therapy.    Follow up with Urology, after antibiotic completion. She will need eventually percutaneous nephrolithonomy for definitive management of the stone.   Discontinue telemetry.  Follow cell count in am.   2. AKI on CKD stage 3a. hyponatremia. Serum cr likely back to baseline, CKD stage 3a. Cr today is 1,24 with K of 3,7 and serum bicarbonate of 20. NA 136 Patient now off IV fluids and tolerating po well.   Follow renal function in am.   3. Dementia, complicated with delirium/ acute metabolic encephalopathy.  This am patient is more  awake and alert. Last dose of haloperidol was 1 mg po at 10:17 am.  Last night with no major agitation.    Continue with neuro checks per unit protocol. PT and OT evaluation, encourage out of bed to chair tid with meals.  Continue with donepezil at night. Fall and aspiration precautions.   4. HTN/ dyslipidemia. Blood pressure stable, continue to hold on B blocker to prevent bradycardia.   Continue with statin therapy.   Patient continue to be at high risk for worsening sepsis and encephalopathy   Status is: Inpatient  Remains inpatient appropriate because:IV treatments appropriate due to intensity of illness or inability to take PO   Dispo: The patient is from: ALF              Anticipated d/c is to: ALF              Patient currently is not medically stable to d/c.   Difficult to place patient No   DVT prophylaxis: Enoxaparin   Code Status:   Full  Family Communication:  I spoke with her POA yesterday at the bedside and all questions were addressed.      Consultants:   Urology   IR   Procedures:   Left nephrostomy tube   Antimicrobials:  Ceftriaxone high dose.   Subjective: Patient is feeling better, she is more calm today and off restrains, no nausea or vomiting, no chest pain or dyspnea.  Mentation consistent with dementia.   Objective: Vitals:   04/29/21 2200 04/29/21 2232 04/30/21 0504 04/30/21 0505  BP: 132/64 132/64 (!) 155/70   Pulse: 77 72 60   Resp: 18     Temp: 99.2 F (37.3 C) 99.2 F (37.3 C) 99.7 F (37.6 C)   TempSrc: Oral Oral Oral   SpO2: 91% 92% 90% 100%  Weight:        Intake/Output Summary (Last 24 hours) at 04/30/2021 1036 Last data filed at 04/30/2021 0200 Gross per 24 hour  Intake 420 ml  Output 1100 ml  Net -680 ml   Filed Weights   04/27/21 1212  Weight: 61.2 kg    Examination:   General: Not in pain or dyspnea, deconditioned  Neurology: Awake and alert, non focal  E ENT: mild pallor, no icterus, oral mucosa moist Cardiovascular: No JVD. S1-S2 present, rhythmic, no gallops, rubs, or murmurs. No lower extremity  edema. Pulmonary: positive breath sounds bilaterally, adequate air movement, no wheezing, rhonchi or rales. Gastrointestinal. Abdomen soft and non tender Skin. No rashes Musculoskeletal: no joint deformities     Data Reviewed: I have personally reviewed following labs and imaging studies  CBC: Recent Labs  Lab 04/27/21 1242 04/28/21 0029 04/29/21 0610 04/30/21 0526  WBC 26.0* 18.5* 12.6* 12.3*  NEUTROABS 22.9*  --  10.8* 9.4*  HGB 13.5 12.2 13.1 13.3  HCT 40.1 36.4 39.1 39.9  MCV 92.2 93.1 91.6 92.4  PLT 132* 112* 126* 122*   Basic Metabolic Panel: Recent Labs  Lab 04/27/21 1242 04/28/21 0029 04/29/21 0610 04/30/21 0526  NA 134* 136 136 136  K 4.1 3.7 3.0* 3.7  CL 101 109 107 107  CO2 29 22 23  20*  GLUCOSE 110* 101* 103* 94  BUN 29* 25* 19 12  CREATININE 1.77* 1.32* 1.26* 1.24*  CALCIUM 9.3 8.5* 8.6* 8.7*   GFR: CrCl cannot be calculated (Unknown ideal weight.). Liver Function Tests: Recent Labs  Lab 04/27/21 1242 04/28/21 0029  AST 19 20  ALT 10 12  ALKPHOS 63  53  BILITOT 1.7* 1.3*  PROT 7.1 5.6*  ALBUMIN 3.4* 2.7*   No results for input(s): LIPASE, AMYLASE in the last 168 hours. No results for input(s): AMMONIA in the last 168 hours. Coagulation Profile: Recent Labs  Lab 04/27/21 1313 04/28/21 0029  INR 1.2 1.2   Cardiac Enzymes: No results for input(s): CKTOTAL, CKMB, CKMBINDEX, TROPONINI in the last 168 hours. BNP (last 3 results) No results for input(s): PROBNP in the last 8760 hours. HbA1C: No results for input(s): HGBA1C in the last 72 hours. CBG: No results for input(s): GLUCAP in the last 168 hours. Lipid Profile: No results for input(s): CHOL, HDL, LDLCALC, TRIG, CHOLHDL, LDLDIRECT in the last 72 hours. Thyroid Function Tests: No results for input(s): TSH, T4TOTAL, FREET4, T3FREE, THYROIDAB in the last 72 hours. Anemia Panel: No results for input(s): VITAMINB12, FOLATE, FERRITIN, TIBC, IRON, RETICCTPCT in the last 72  hours.    Radiology Studies: I have reviewed all of the imaging during this hospital visit personally     Scheduled Meds: . darifenacin  7.5 mg Oral Daily  . donepezil  10 mg Oral QHS  . simvastatin  20 mg Oral q1800   Continuous Infusions: . cefTRIAXone (ROCEPHIN)  IV 2 g (04/30/21 1028)     LOS: 3 days        Zenita Kister Annett Gula, MD

## 2021-05-01 LAB — CBC WITH DIFFERENTIAL/PLATELET
Abs Immature Granulocytes: 0.07 10*3/uL (ref 0.00–0.07)
Basophils Absolute: 0 10*3/uL (ref 0.0–0.1)
Basophils Relative: 0 %
Eosinophils Absolute: 0.2 10*3/uL (ref 0.0–0.5)
Eosinophils Relative: 2 %
HCT: 35.6 % — ABNORMAL LOW (ref 36.0–46.0)
Hemoglobin: 12.1 g/dL (ref 12.0–15.0)
Immature Granulocytes: 1 %
Lymphocytes Relative: 15 %
Lymphs Abs: 1.5 10*3/uL (ref 0.7–4.0)
MCH: 30.6 pg (ref 26.0–34.0)
MCHC: 34 g/dL (ref 30.0–36.0)
MCV: 89.9 fL (ref 80.0–100.0)
Monocytes Absolute: 1.4 10*3/uL — ABNORMAL HIGH (ref 0.1–1.0)
Monocytes Relative: 13 %
Neutro Abs: 7.4 10*3/uL (ref 1.7–7.7)
Neutrophils Relative %: 69 %
Platelets: 136 10*3/uL — ABNORMAL LOW (ref 150–400)
RBC: 3.96 MIL/uL (ref 3.87–5.11)
RDW: 13.8 % (ref 11.5–15.5)
WBC: 10.6 10*3/uL — ABNORMAL HIGH (ref 4.0–10.5)
nRBC: 0 % (ref 0.0–0.2)

## 2021-05-01 LAB — BASIC METABOLIC PANEL
Anion gap: 6 (ref 5–15)
BUN: 12 mg/dL (ref 8–23)
CO2: 26 mmol/L (ref 22–32)
Calcium: 8.6 mg/dL — ABNORMAL LOW (ref 8.9–10.3)
Chloride: 105 mmol/L (ref 98–111)
Creatinine, Ser: 1.2 mg/dL — ABNORMAL HIGH (ref 0.44–1.00)
GFR, Estimated: 45 mL/min — ABNORMAL LOW (ref >60)
Glucose, Bld: 94 mg/dL (ref 70–99)
Potassium: 3.5 mmol/L (ref 3.5–5.1)
Sodium: 137 mmol/L (ref 135–145)

## 2021-05-01 LAB — CBC
HCT: 38.1 % (ref 36.0–46.0)
Hemoglobin: 13 g/dL (ref 12.0–15.0)
MCH: 31 pg (ref 26.0–34.0)
MCHC: 34.1 g/dL (ref 30.0–36.0)
MCV: 90.7 fL (ref 80.0–100.0)
Platelets: 160 10*3/uL (ref 150–400)
RBC: 4.2 MIL/uL (ref 3.87–5.11)
RDW: 13.8 % (ref 11.5–15.5)
WBC: 11.2 10*3/uL — ABNORMAL HIGH (ref 4.0–10.5)
nRBC: 0 % (ref 0.0–0.2)

## 2021-05-01 MED ORDER — ATENOLOL 25 MG PO TABS
25.0000 mg | ORAL_TABLET | Freq: Every day | ORAL | Status: DC
  Administered 2021-05-01 – 2021-05-03 (×3): 25 mg via ORAL
  Filled 2021-05-01 (×3): qty 1

## 2021-05-01 NOTE — Progress Notes (Signed)
  Subjective: Resting comfortably  Objective: Vital signs in last 24 hours: Temp:  [98.7 F (37.1 C)-99.6 F (37.6 C)] 99.4 F (37.4 C) (05/25 0448) Pulse Rate:  [70-86] 86 (05/25 0448) Resp:  [16-17] 16 (05/25 0448) BP: (104-143)/(49-67) 104/49 (05/25 0448) SpO2:  [96 %] 96 % (05/25 0448)  Intake/Output from previous day: 05/24 0701 - 05/25 0700 In: 240 [P.O.:240] Out: 325 [Urine:325] Intake/Output this shift: No intake/output data recorded.  Physical Exam:  General: Alert and oriented   Lab Results: Recent Labs    04/29/21 0610 04/30/21 0526 05/01/21 0538  HGB 13.1 13.3 12.1  HCT 39.1 39.9 35.6*   BMET Recent Labs    04/30/21 0526 05/01/21 0538  NA 136 137  K 3.7 3.5  CL 107 105  CO2 20* 26  GLUCOSE 94 94  BUN 12 12  CREATININE 1.24* 1.20*  CALCIUM 8.7* 8.6*     Studies/Results: No results found.  Assessment/Plan: Staghorn calculus status post left percutaneous nephrostomy tube placement for definitive drainage.  Improving on IV antibiotics for Klebsiella urosepsis. Plan/recommendation will need 2 weeks of antibiotics due to bacteremia and follow-up for scheduling of likely percutaneous nephrolithotomy    LOS: 4 days   [name] 05/01/2021, 12:15 PM

## 2021-05-01 NOTE — Evaluation (Signed)
Physical Therapy Evaluation Patient Details Name: Annette Vaughn MRN: [id number] DOB: 1935/09/02 Today's Date: 05/01/2021   History of Present Illness  86 year old female, former Runner, broadcasting/film/video and school principal, admitted to the hospital with the working diagnosis of sepsis due to left pyelonephritis in the setting of obstructive uropathy with staghorn calculus (present on admission). Past medical history: hypertension, dyslipidemia and dementia who was brought to the hospital due to generalized weakness and dysuria.  Ongoing symptoms for last 3 to 4 weeks, with progressive generalized weakness, and left flank pain. S/P left nephrostomy on 5/22  Clinical Impression  On eval, pt was Min A for mobility. She walked ~250 feet around the unit. Multiple instances of loss of balance during session requiring assist from therapist to prevent fall. Unsure of exactly how much supervision pt will have available to her at home. Recommend daily ambulation with nursing supervision/assistance in addition to PT sessions.     Follow Up Recommendations Home health PT;Supervision/Assistance - 24 hour (SNF if 24/7 not available)    Equipment Recommendations  Rolling walker with 5" wheels (may possibly need for ambulation safety)    Recommendations for Other Services       Precautions / Restrictions Precautions Precautions: Fall Precaution Comments: L nephrostomy tube Restrictions Weight Bearing Restrictions: No      Mobility  Bed Mobility               General bed mobility comments: oob in recliner    Transfers Overall transfer level: Needs assistance Equipment used: None Transfers: Sit to/from Stand Sit to Stand: Min assist         General transfer comment: Unsteady.  Ambulation/Gait Ambulation/Gait assistance: Min Chemical engineer (Feet): 250 Feet Assistive device: IV Pole Gait Pattern/deviations: Step-through pattern;Decreased stride length     General Gait Details: Unsteady. LOB  multiple times requiring external assist from therapist to prevent fall. Pt tolerated distance well.  Stairs            Wheelchair Mobility    Modified Rankin (Stroke Patients Only)       Balance Overall balance assessment: Needs assistance           Standing balance-Leahy Scale: Fair                               Pertinent Vitals/Pain Pain Assessment: Faces Faces Pain Scale: Hurts a little bit Pain Location: back Pain Descriptors / Indicators: Sore Pain Intervention(s): Monitored during session    Home Living                   Additional Comments: Patient from [facility]. Has a caregiver from 11-8 (??)    Prior Function           Comments: Unsure. Patient unreliable historian.     Hand Dominance   Dominant Hand: Right    Extremity/Trunk Assessment   Upper Extremity Assessment Upper Extremity Assessment: Defer to OT evaluation    Lower Extremity Assessment Lower Extremity Assessment: Generalized weakness    Cervical / Trunk Assessment Cervical / Trunk Assessment: Normal  Communication   Communication: HOH  Cognition Arousal/Alertness: Awake/alert Behavior During Therapy: WFL for tasks assessed/performed Overall Cognitive Status: No family/caregiver present to determine baseline cognitive functioning                                 General  Comments: Hx history of dementia - unsure if she is more confused than typical. Alert to self only. Able to follow all commands.      General Comments      Exercises     Assessment/Plan    PT Assessment Patient needs continued PT services  PT Problem List Decreased mobility;Decreased balance;Decreased knowledge of use of DME;Decreased strength       PT Treatment Interventions DME instruction;Gait training;Therapeutic exercise;Balance training;Functional mobility training;Therapeutic activities;Patient/family education;Stair training    PT  Goals (Current goals can be found in the Care Plan section)  Acute Rehab PT Goals Patient Stated Goal: home PT Goal Formulation: With patient Time For Goal Achievement: 05/15/21 Potential to Achieve Goals: Good    Frequency Min 3X/week   Barriers to discharge        Co-evaluation               AM-PAC PT "6 Clicks" Mobility  Outcome Measure Help needed turning from your back to your side while in a flat bed without using bedrails?: None Help needed moving from lying on your back to sitting on the side of a flat bed without using bedrails?: A Little Help needed moving to and from a bed to a chair (including a wheelchair)?: A Little Help needed standing up from a chair using your arms (e.g., wheelchair or bedside chair)?: A Little Help needed to walk in hospital room?: A Little Help needed climbing 3-5 steps with a railing? : A Lot 6 Click Score: 18    End of Session Equipment Utilized During Treatment: Gait belt Activity Tolerance: Patient tolerated treatment well Patient left: in chair;with call bell/phone within reach;with chair alarm set   PT Visit Diagnosis: Unsteadiness on feet (R26.81);Muscle weakness (generalized) (M62.81)    Time: 9528-4132 PT Time Calculation (min) (ACUTE ONLY): 23 min   Charges:   PT Evaluation $PT Eval Moderate Complexity: 1 Mod PT Treatments $Gait Training: 8-22 mins          Faye Ramsay, PT Acute Rehabilitation  Office: [phone] Pager: [phone]

## 2021-05-01 NOTE — Evaluation (Signed)
Occupational Therapy Evaluation Patient Details Name: Annette Vaughn MRN: [id number] DOB: Jul 23, 1935 Today's Date: 05/01/2021    History of Present Illness Annette Vaughn was admitted to the hospital with the working diagnosis of sepsis due to left pyelonephritis in the setting of obstructive uropathy with staghorn calculus (present on admission).  86 year old female, former Runner, broadcasting/film/video and school principal, with the past medical history for hypertension, dyslipidemia and dementia who was brought to the hospital due to generalized weakness and dysuria.  Ongoing symptoms for last 3 to 4 weeks, with progressive generalized weakness, and left flank pain. status post left nephrostomy on 5/22   Clinical Impression   Annette Vaughn is an 86 year old woman who presents from [facility] supine in bed with left sided nephrostomy tube. Unsure of patient's prior level of function as she is unreliable historian. She is alert to self only. Patient has a caregiver that sits with her - potentially from 11-8 but not sure of accuracy. Unsure if there is a night caregiver as well. Will need to find out from family. On evaluation patient supervision to transfer to side of bed and min guard to ambulate in room with a device. She is able to don socks at side of bed, min assist to don underwear and min guard to perform toileting. Patient exhibits generalized weakness and poor activity tolerance - reporting mild dizziness with ambulation room - but overall near her baseline. Would expect patient may need close to 24/7 supervision at least initially if she returns to independent living. If that level of assistance not available may need short term rehab.    Follow Up Recommendations  Home health OT;Supervision/Assistance - 24 hour;SNF    Equipment Recommendations  None recommended by OT    Recommendations for Other Services       Precautions / Restrictions Precautions Precautions: Other  (comment) Precaution Comments: L nephrostomy tube Restrictions Weight Bearing Restrictions: No      Mobility Bed Mobility Overal bed mobility: Needs Assistance Bed Mobility: Supine to Sit     Supine to sit: Supervision          Transfers Overall transfer level: Needs assistance Equipment used: None Transfers: Sit to/from Stand Sit to Stand: Min guard         General transfer comment: Min guard to ambulate in room without device. Reports mild dizziness without loss of balance. Reports "I just need to move slow today"    Balance Overall balance assessment: Mild deficits observed, not formally tested                                         ADL either performed or assessed with clinical judgement   ADL Overall ADL's : Needs assistance/impaired Eating/Feeding: Set up;Sitting   Grooming: Standing;Wash/dry hands Grooming Details (indicate cue type and reason): at sink Upper Body Bathing: Set up;Sitting;Supervision/ safety   Lower Body Bathing: Set up;Sit to/from stand;Min guard   Upper Body Dressing : Set up;Sitting   Lower Body Dressing: Sit to/from stand;Minimal assistance Lower Body Dressing Details (indicate cue type and reason): min assist to pull mesh underwear up over nephrostomy tube site and verbal cue to inititate task (not uncommon - mesh panties so light patients don't feel them) Toilet Transfer: Min guard;Regular Toilet;Grab bars   Toileting- Clothing Manipulation and Hygiene: Min guard;Sit to/from stand  Vision Baseline Vision/History: Wears glasses Patient Visual Report: No change from baseline       Perception     Praxis      Pertinent Vitals/Pain Pain Assessment: Faces Faces Pain Scale: Hurts a little bit Pain Descriptors / Indicators: Sore Pain Intervention(s): Monitored during session     Hand Dominance Right   Extremity/Trunk Assessment Upper Extremity Assessment Upper Extremity Assessment:  Overall WFL for tasks assessed   Lower Extremity Assessment Lower Extremity Assessment: Defer to PT evaluation   Cervical / Trunk Assessment Cervical / Trunk Assessment: Normal   Communication Communication Communication: HOH   Cognition Arousal/Alertness: Awake/alert Behavior During Therapy: WFL for tasks assessed/performed Overall Cognitive Status: No family/caregiver present to determine baseline cognitive functioning                                 General Comments: Hx history of dementia - unsure if she is more confused than typical. Alert to self only. Able to follow all commands.   General Comments       Exercises     Shoulder Instructions      Home Living Family/patient expects to be discharged to:: Other (Comment)                                 Additional Comments: Patient from [facility]. Has a caregiver from 11-8 (??)      Prior Functioning/Environment          Comments: Unsure. Patient unreliable historian.        OT Problem List: Decreased activity tolerance;Impaired balance (sitting and/or standing)      OT Treatment/Interventions: Self-care/ADL training;DME and/or AE instruction;Therapeutic activities;Balance training;Patient/family education    OT Goals(Current goals can be found in the care plan section) Acute Rehab OT Goals OT Goal Formulation: Patient unable to participate in goal setting Time For Goal Achievement: 05/15/21 Potential to Achieve Goals: Good  OT Frequency: Min 2X/week   Barriers to D/C:            Co-evaluation              AM-PAC OT "6 Clicks" Daily Activity     Outcome Measure Help from another person eating meals?: A Little Help from another person taking care of personal grooming?: A Little Help from another person toileting, which includes using toliet, bedpan, or urinal?: A Little Help from another person bathing (including washing, rinsing, drying)?: A  Little Help from another person to put on and taking off regular upper body clothing?: A Little Help from another person to put on and taking off regular lower body clothing?: A Little 6 Click Score: 18   End of Session Nurse Communication: Mobility status  Activity Tolerance: Patient tolerated treatment well Patient left: in chair;with call bell/phone within reach;with chair alarm set  OT Visit Diagnosis: Muscle weakness (generalized) (M62.81)                Time: 3500-9381 OT Time Calculation (min): 29 min Charges:  OT General Charges $OT Visit: 1 Visit OT Evaluation $OT Eval Low Complexity: 1 Low OT Treatments $Self Care/Home Management : 8-22 mins  Inez Rosato, OTR/L Acute Care Rehab Services  Office [phone] Pager: [phone]   Kelli Churn 05/01/2021, 9:23 AM

## 2021-05-01 NOTE — TOC Progression Note (Signed)
Transition of Care [facility]) - Progression Note    Patient Details  Name: Annette Vaughn MRN: [id number] Date of Birth: 27-Dec-1934  Transition of [facility]) CM/SW Contact  Ilani Otterson, Meriam Sprague, RN Phone Number: 05/01/2021, 1:47 PM  Clinical Narrative:    Spoke with pt and caregiver Shamara at bedside for dc planning. It appears that pt will dc with Neph tube. Mahlon Gammon gave me a contact for her home care company to see if she is able to care for tube at dc. Spoke with Jilda Panda from [employer] [phone]) to inform him of the exact care needed for the tube. Jilda Panda states that it is ok for our RN to teach the care to [facility] as we often teach friend's and family members to care for tubes. RN [facility] to teach Shamara to care for Neph tube prior to dc. All above information given to POA Dorian as well.

## 2021-05-01 NOTE — Progress Notes (Signed)
PROGRESS NOTE    Annette Vaughn  KYH:[id number] DOB: 1935/07/13 DOA: 04/27/2021 PCP: No primary care provider on file.    Brief Narrative:  [name] with history of hypertension, dementia, dyslipidemia, nephrolithiasis resident of [facility] was admitted on 5/21 with severe sepsis, left pyelonephritis and obstructive uropathy from a staghorn calculus. -Renal CT with large staghorn calculus in the left renal pelvis with dilatation of the upper pole calyces and contour abnormality with low attenuation in the upper lobe. Suspected intrarenal collection in the setting of large staghorn calculus with marked perinephric stranding and associated hydronephrosis. -Treated with IV fluids and antibiotic therapy, urology and IR were consulted -On 5/22 underwent percutaneous left percutaneous nephrostomy and IR -Blood and urine cultures grew Klebsiella -Hospitalization complicated by toxic encephalopathy  Assessment & Plan:    Severe sepsis due to left pyelonephritis in the setting of staghorn calculus/ obstructive uropathy,  -Clinically improving, remains on IV ceftriaxone  Day 5 -Urology following, recommended percutaneous nephrostomy by IR which was completed on 5/22 -Blood and urine cultures with Klebsiella, MIC for cefazolin is 16 -Continue IV ceftriaxone 1-2 more days and transition to quinolone to complete 10 day therapy -Continue left percutaneous nephrostomy at discharge, will need close urology follow-up(Dr. Benancio Deeds)  for definitive management of her stone, likely percutaneous nephrolithotomy -Ambulate, PT OT  AKI on CKD stage 3a. hyponatremia.  -Creatinine improved with hydration, resolution of sepsis  Delirium, toxic encephalopathy  -Required as needed Haldol and restraints this admission, now improving  Dementia -Continue donezepil  HTN/ dyslipidemia.  -Restart atenolol at a lower dose  DVT prophylaxis: Enoxaparin   Code Status:   Full  Family  Communication:  No family at bedside, will update.   Remains inpatient appropriate because:IV treatments appropriate due to intensity of illness or inability to take PO   Dispo: The patient is from: [facility]              Anticipated d/c is to: To be determined              Patient currently is not medically stable to d/c.   Difficult to place patient No   Consultants:   Urology   IR   Procedures:   Left nephrostomy tube   Antimicrobials:  Ceftriaxone high dose.   Subjective: -No specific complaints, eating breakfast, pleasantly confused  Objective: Vitals:   04/30/21 0505 04/30/21 1303 04/30/21 2027 05/01/21 0448  BP:  123/67 (!) 143/55 (!) 104/49  Pulse:  77 70 86  Resp:  17 16 16   Temp:  99.6 F (37.6 C) 98.7 F (37.1 C) 99.4 F (37.4 C)  TempSrc:  Oral Oral Oral  SpO2: 100% 96% 96% 96%  Weight:        Intake/Output Summary (Last 24 hours) at 05/01/2021 1112 Last data filed at 05/01/2021 05/03/2021 Gross per 24 hour  Intake 240 ml  Output 325 ml  Net -85 ml   Filed Weights   04/27/21 1212  Weight: 61.2 kg    Examination:   General: Pleasant elderly female, sitting up in bed, awake alert oriented to self and partly to place only HEENT: No JVD, positive pallor no icterus CVS: S1-S2, regular rate rhythm Lungs: Decreased breath sounds to bases Abdomen: Soft, nontender, left percutaneous nephrostomy drain with clear urine, bowel sounds present Extremities: No edema   Data Reviewed: I have personally reviewed following labs and imaging studies  CBC: Recent Labs  Lab 04/27/21 1242 04/28/21 0029 04/29/21 0610 04/30/21 0526  05/01/21 0538  WBC 26.0* 18.5* 12.6* 12.3* 10.6*  NEUTROABS 22.9*  --  10.8* 9.4* 7.4  HGB 13.5 12.2 13.1 13.3 12.1  HCT 40.1 36.4 39.1 39.9 35.6*  MCV 92.2 93.1 91.6 92.4 89.9  PLT 132* 112* 126* 122* 136*   Basic Metabolic Panel: Recent Labs  Lab 04/27/21 1242 04/28/21 0029 04/29/21 0610  04/30/21 0526 05/01/21 0538  NA 134* 136 136 136 137  K 4.1 3.7 3.0* 3.7 3.5  CL 101 109 107 107 105  CO2 29 22 23  20* 26  GLUCOSE 110* 101* 103* 94 94  BUN 29* 25* 19 12 12   CREATININE 1.77* 1.32* 1.26* 1.24* 1.20*  CALCIUM 9.3 8.5* 8.6* 8.7* 8.6*   GFR: CrCl cannot be calculated (Unknown ideal weight.). Liver Function Tests: Recent Labs  Lab 04/27/21 1242 04/28/21 0029  AST 19 20  ALT 10 12  ALKPHOS 63 53  BILITOT 1.7* 1.3*  PROT 7.1 5.6*  ALBUMIN 3.4* 2.7*   No results for input(s): LIPASE, AMYLASE in the last 168 hours. No results for input(s): AMMONIA in the last 168 hours. Coagulation Profile: Recent Labs  Lab 04/27/21 1313 04/28/21 0029  INR 1.2 1.2   Cardiac Enzymes: No results for input(s): CKTOTAL, CKMB, CKMBINDEX, TROPONINI in the last 168 hours. BNP (last 3 results) No results for input(s): PROBNP in the last 8760 hours. HbA1C: No results for input(s): HGBA1C in the last 72 hours. CBG: No results for input(s): GLUCAP in the last 168 hours. Lipid Profile: No results for input(s): CHOL, HDL, LDLCALC, TRIG, CHOLHDL, LDLDIRECT in the last 72 hours. Thyroid Function Tests: No results for input(s): TSH, T4TOTAL, FREET4, T3FREE, THYROIDAB in the last 72 hours. Anemia Panel: No results for input(s): VITAMINB12, FOLATE, FERRITIN, TIBC, IRON, RETICCTPCT in the last 72 hours.    Radiology Studies: I have reviewed all of the imaging during this hospital visit personally     Scheduled Meds: . darifenacin  7.5 mg Oral Daily  . donepezil  10 mg Oral QHS  . simvastatin  20 mg Oral q1800   Continuous Infusions: . cefTRIAXone (ROCEPHIN)  IV 2 g (05/01/21 1039)     LOS: 4 days    04/30/21, MD

## 2021-05-01 NOTE — Care Management Important Message (Signed)
Important Message  Patient Details IM Letter given to the Patient. Name: Annette Vaughn MRN: [id number] Date of Birth: 01/17/1935   Medicare Important Message Given:  Yes     Caren Macadam 05/01/2021, 10:38 AM

## 2021-05-01 NOTE — Progress Notes (Signed)
Referring Physician(s): Newsome,G  Supervising Physician: Ruel Favors  Patient Status:  [name] - In-pt  Chief Complaint: Left renal stone, urosepsis, dysuria   Subjective: Patient currently sitting up in chair eating breakfast; denies worsening flank pain, nausea, vomiting; more alert and talkative today.   Allergies: Patient has no known allergies.  Medications: Prior to Admission medications   Medication Sig Start Date End Date Taking? Authorizing Provider  acetaminophen (TYLENOL) 325 MG tablet Take 650 mg by mouth every 6 (six) hours as needed for mild pain.   Yes [provider]  atenolol (TENORMIN) 50 MG tablet Take 50 mg by mouth daily.   Yes [provider]  donepezil (ARICEPT) 10 MG tablet Take 10 mg by mouth at bedtime.   Yes [provider]  simvastatin (ZOCOR) 20 MG tablet Take 20 mg by mouth at bedtime.   Yes [provider]  solifenacin (VESICARE) 5 MG tablet Take 5 mg by mouth at bedtime.   Yes [provider]     Vital Signs: BP (!) 104/49 (BP Location: Right Arm)   Pulse 86   Temp 99.4 F (37.4 C) (Oral)   Resp 16   Wt 135 lb (61.2 kg)   SpO2 96%   Physical Exam awake, answering simple questions okay.  Left nephrostomy intact, insertion site okay, nontender, output 325 cc yellow urine  Imaging: CT ABDOMEN PELVIS WO CONTRAST  Result Date: 04/27/2021 CLINICAL DATA:  Pain, acute nonlocalized abdominal pain in a 86 year old female. EXAM: CT ABDOMEN AND PELVIS WITHOUT CONTRAST TECHNIQUE: Multidetector CT imaging of the abdomen and pelvis was performed following the standard protocol without IV contrast. COMPARISON:  None FINDINGS: Lower chest: Basilar atelectasis. No substantial pleural effusion. Signs of mitral annular and aortic valvular calcifications. Heart is incompletely imaged. Hepatobiliary: Liver with smooth contours.  Cholelithiasis. Pancreas: Pancreas with normal contour no sign of inflammation. Spleen:  Normal size and contour. Adrenals/Urinary Tract: Adrenal glands are normal. Moderate LEFT upper pole hydronephrosis in the setting of large "staghorn type calculus at the LEFT UPJ extending into lower pole calices. This calculus measures 2.6 x 2.6 x 1.6 cm. There is moderate to marked LEFT-sided perinephric stranding. Small low-density area in the upper pole the LEFT kidney may represent a cyst or small intrarenal collection. The ureter beyond this large calculus is decompressed. The urinary bladder is unremarkable. RIGHT kidney with cortical scarring. Stomach/Bowel: Small hiatal hernia. Small bowel is normal caliber. Some fluid-filled loops throughout the abdomen, no signs of adjacent stranding. Liquid stool in the colon. Minimal diverticular changes in the sigmoid. Vascular/Lymphatic: Calcified atheromatous plaque in the abdominal aorta. Smooth contour of the IVC. There is no gastrohepatic or hepatoduodenal ligament lymphadenopathy. No retroperitoneal or mesenteric lymphadenopathy. No pelvic sidewall lymphadenopathy. Reproductive: Post hysterectomy. Other: No free air.  No ascites. Musculoskeletal: No acute bone finding. No destructive bone process. Spinal degenerative changes. Osteopenia. IMPRESSION: 1. Large staghorn calculus in the LEFT renal pelvis with dilation of upper pole calyces and contour abnormality with low attenuation in the upper pole. Contour abnormality could represent a cyst but is suspicious for small intrarenal collection and or developing XGP in the setting of large staghorn calculus with marked perinephric stranding and associated hydronephrosis. 2. Urologic consultation at or close follow-up may be warranted along with correlation with urine studies for further evaluation. 3. Cholelithiasis with large gallstone in the gallbladder measuring approximately 2.5 cm greatest dimension. 4. Small hiatal hernia. 5. Minimal diverticular changes in the sigmoid. 6. Aortic atherosclerosis. Aortic  Atherosclerosis (ICD10-I70.0). Electronically Signed   By: Donzetta Kohut M.D.   On: 04/27/2021 16:35   DG Chest Portable 1 View  Result Date: 04/27/2021 CLINICAL DATA:  86 year old female with fever. EXAM: PORTABLE CHEST 1 VIEW COMPARISON:  None. FINDINGS: The cardiomediastinal silhouette is unremarkable. There is no evidence of focal airspace disease, pulmonary edema, suspicious pulmonary nodule/mass, pleural effusion, or pneumothorax. No acute bony abnormalities are identified. IMPRESSION: No active disease. Electronically Signed   By: Harmon Pier M.D.   On: 04/27/2021 14:09   IR NEPHROSTOMY PLACEMENT LEFT  Result Date: 04/28/2021 INDICATION: 86 year old female with urosepsis secondary to an obstructing and infected staghorn within the left renal pelvis. She presents for percutaneous nephrostomy tube placement. EXAM: IR NEPHROSTOMY PLACEMENT LEFT COMPARISON:  None. MEDICATIONS: Rocephin 2 g IV ANESTHESIA/SEDATION: Fentanyl 25 mcg IV; Versed 1 mg IV Moderate Sedation Time:  12 minutes The patient was continuously monitored during the procedure by the interventional radiology nurse under my direct supervision. CONTRAST:  68mL OMNIPAQUE IOHEXOL 300 MG/ML SOLN - administered into the collecting system(s) FLUOROSCOPY TIME:  Fluoroscopy Time: 1 minutes 6 seconds (42 mGy). COMPLICATIONS: None immediate. TECHNIQUE: The procedure, risks, benefits, and alternatives were explained to the patient. Questions regarding the procedure were encouraged and answered. The patient understands and consents to the procedure. The left flank was prepped with chlorhexidine in a sterile fashion, and a sterile drape was applied covering the operative field. A sterile gown and sterile gloves were used for the procedure. Local anesthesia was provided with 1% Lidocaine. The left flank was interrogated with ultrasound and the left kidney identified. The kidney is hydronephrotic. A suitable access site on the skin overlying the lower  pole, posterior calix was identified. After local mg anesthesia was achieved, a small skin nick was made with an 11 blade scalpel. A 21 gauge Accustick needle was then advanced under direct sonographic guidance into the lower pole of the left kidney. A 0.018 inch wire was advanced under fluoroscopic guidance into the left renal collecting system. The Accustick sheath was then advanced over the wire and a 0.018 system exchanged for a 0.035 system. Gentle hand injection of contrast material confirms placement of the sheath within the renal collecting system. There is moderate hydronephrosis and a large staghorn calculus primarily involving the renal pelvis and the lower pole and interpolar infundibulum. The tract from the scan into the renal collecting system was then dilated serially to 10-French. A 10-French Cook all-purpose drain was then placed and positioned under fluoroscopic guidance. The locking loop is well formed within the upper pole infundibulum in the most dilated portion of the collecting system. The catheter was secured to the skin with 2-0 Prolene and a sterile bandage was placed. Catheter was left to gravity bag drainage. IMPRESSION: Successful placement of a left 10 French percutaneous nephrostomy tube. Electronically Signed   By: Malachy Moan M.D.   On: 04/28/2021 10:07    Labs:  CBC: Recent Labs    04/28/21 0029 04/29/21 0610 04/30/21 0526 05/01/21 0538  WBC 18.5* 12.6* 12.3* 10.6*  HGB 12.2 13.1 13.3 12.1  HCT 36.4 39.1 39.9 35.6*  PLT 112* 126* 122* 136*    COAGS: Recent Labs    04/27/21 1313 04/28/21 0029  INR 1.2 1.2  APTT 30  --     BMP: Recent Labs    04/28/21 0029 04/29/21 0610 04/30/21 0526 05/01/21 0538  NA 136 136 136 137  K 3.7 3.0* 3.7 3.5  CL 109 107 107 105  CO2 22 23 20* 26  GLUCOSE 101* 103* 94 94  BUN 25* 19 12 12   CALCIUM 8.5* 8.6* 8.7* 8.6*  CREATININE 1.32* 1.26* 1.24* 1.20*  GFRNONAA 40* 42* 43* 45*    LIVER FUNCTION  TESTS: Recent Labs    04/27/21 1242 04/28/21 0029  BILITOT 1.7* 1.3*  AST 19 20  ALT 10 12  ALKPHOS 63 53  PROT 7.1 5.6*  ALBUMIN 3.4* 2.7*    Assessment and Plan: Patient with history of urosepsis secondary to obstructing and infected staghorn stone within the left renal pelvis, status post left nephrostomy on 5/22; temp 99.4, WBC 10.6 down from 12.3, hemoglobin stable at 12.1, creatinine 1.20 down from 1.24.  Preliminary urine cultures growing rare Klebsiella, sensitivities pending; continue current treatment/hydrate, additional plans as per urology- eventual  PCNL   Electronically Signed: D. 6/22, PA-C 05/01/2021, 9:10 AM   I spent a total of 15 minutes at the the patient's bedside AND on the patient's hospital floor or unit, greater than 50% of which was counseling/coordinating care for left nephrostomy    Patient ID: Annette Vaughn, female   DOB: 1935-03-16, 86 y.o.   MRN: [id number]

## 2021-05-02 LAB — BASIC METABOLIC PANEL
Anion gap: 5 (ref 5–15)
BUN: 13 mg/dL (ref 8–23)
CO2: 26 mmol/L (ref 22–32)
Calcium: 8.5 mg/dL — ABNORMAL LOW (ref 8.9–10.3)
Chloride: 106 mmol/L (ref 98–111)
Creatinine, Ser: 1.12 mg/dL — ABNORMAL HIGH (ref 0.44–1.00)
GFR, Estimated: 48 mL/min — ABNORMAL LOW (ref >60)
Glucose, Bld: 87 mg/dL (ref 70–99)
Potassium: 3.2 mmol/L — ABNORMAL LOW (ref 3.5–5.1)
Sodium: 137 mmol/L (ref 135–145)

## 2021-05-02 MED ORDER — POTASSIUM CHLORIDE CRYS ER 20 MEQ PO TBCR
40.0000 meq | EXTENDED_RELEASE_TABLET | Freq: Once | ORAL | Status: AC
  Administered 2021-05-02: 40 meq via ORAL
  Filled 2021-05-02: qty 2

## 2021-05-02 NOTE — Progress Notes (Addendum)
Physical Therapy Treatment Patient Details Name: Annette Vaughn MRN: [id number] DOB: 1935/11/22 Today's Date: 05/02/2021    History of Present Illness 86 year old female, former Runner, broadcasting/film/video and school principal, admitted to the hospital with the working diagnosis of sepsis due to left pyelonephritis in the setting of obstructive uropathy with staghorn calculus (present on admission). Past medical history: hypertension, dyslipidemia and dementia who was brought to the hospital due to generalized weakness and dysuria.  Ongoing symptoms for last 3 to 4 weeks, with progressive generalized weakness, and left flank pain. S/P left nephrostomy on 5/22    PT Comments    Pt assisted with using bathroom and then ambulated good distance in hallway. Pt appears more steady today with use of RW.   Follow Up Recommendations  Home health PT;Supervision/Assistance - 24 hour     Equipment Recommendations  Rolling walker with 5" wheels    Recommendations for Other Services       Precautions / Restrictions Precautions Precautions: Fall Precaution Comments: L nephrostomy tube Restrictions Weight Bearing Restrictions: No    Mobility  Bed Mobility Overal bed mobility: Needs Assistance Bed Mobility: Supine to Sit     Supine to sit: Supervision          Transfers Overall transfer level: Needs assistance Equipment used: None Transfers: Sit to/from Stand Sit to Stand: Min assist         General transfer comment: min/guard from bed, min assist from toilet  Ambulation/Gait Ambulation/Gait assistance: Min guard Gait Distance (Feet): 400 Feet Assistive device: Rolling walker (2 wheeled) Gait Pattern/deviations: Step-through pattern;Decreased stride length     General Gait Details: no unsteadiness with use of RW today, improved distance; caregiver reports pt walks miles outside most days   Stairs             Wheelchair Mobility    Modified Rankin (Stroke Patients Only)        Balance                                            Cognition Arousal/Alertness: Awake/alert Behavior During Therapy: WFL for tasks assessed/performed Overall Cognitive Status: No family/caregiver present to determine baseline cognitive functioning                                 General Comments: Hx history of dementia. Able to follow all commands.      Exercises      General Comments        Pertinent Vitals/Pain Pain Assessment: No/denies pain Pain Intervention(s): Repositioned;Monitored during session    Home Living                      Prior Function            PT Goals (current goals can now be found in the care plan section) Progress towards PT goals: Progressing toward goals    Frequency    Min 3X/week      PT Plan Current plan remains appropriate    Co-evaluation              AM-PAC PT "6 Clicks" Mobility   Outcome Measure  Help needed turning from your back to your side while in a flat bed without using bedrails?: None Help needed moving from lying on your back to sitting  on the side of a flat bed without using bedrails?: None Help needed moving to and from a bed to a chair (including a wheelchair)?: A Little Help needed standing up from a chair using your arms (e.g., wheelchair or bedside chair)?: A Little Help needed to walk in hospital room?: A Little Help needed climbing 3-5 steps with a railing? : A Little 6 Click Score: 20    End of Session Equipment Utilized During Treatment: Gait belt Activity Tolerance: Patient tolerated treatment well Patient left: with call bell/phone within reach;with chair alarm set;in chair;with family/visitor present   PT Visit Diagnosis: Unsteadiness on feet (R26.81);Muscle weakness (generalized) (M62.81)     Time: 1010-1031 PT Time Calculation (min) (ACUTE ONLY): 21 min  Charges:  $Gait Training: 8-22 mins                    Thomasene Mohair PT, DPT Acute Rehabilitation  Services Pager: [phone] Office: [phone]  Sarajane Jews 05/02/2021, 12:39 PM

## 2021-05-02 NOTE — Progress Notes (Signed)
PROGRESS NOTE    Annette Vaughn  BLT:[id number] DOB: 01/30/1935 DOA: 04/27/2021 PCP: No primary care provider on file.    Brief Narrative:  [name] with history of hypertension, dementia, dyslipidemia, nephrolithiasis resident of [facility] was admitted on 5/21 with severe sepsis, left pyelonephritis and obstructive uropathy from a staghorn calculus. -Renal CT with large staghorn calculus in the left renal pelvis with dilatation of the upper pole calyces and contour abnormality with low attenuation in the upper lobe. Suspected intrarenal collection in the setting of large staghorn calculus with marked perinephric stranding and associated hydronephrosis. -Treated with IV fluids and antibiotic therapy, urology and IR were consulted -On 5/22 underwent percutaneous left percutaneous nephrostomy and IR -Blood and urine cultures grew Klebsiella -Hospitalization complicated by toxic encephalopathy  Assessment & Plan:    Severe sepsis due to left pyelonephritis in the setting of staghorn calculus/ obstructive uropathy,  -Clinically improving, remains on IV ceftriaxone  Day 6 -Urology following, recommended percutaneous nephrostomy by IR which was completed on 5/22 -Blood and urine cultures with Klebsiella, MIC for cefazolin is 16 -Continue IV ceftriaxone 1-2 more days and transition to ciprofloxacin at discharge to complete 2 weeks of therapy -Continue left percutaneous nephrostomy at discharge, will need close urology follow-up(Dr. Benancio Deeds)  for definitive management of her stone, percutaneous nephrolithotomy -Ambulate, PT OT -Discharge planning  AKI on CKD stage 3a. hyponatremia.  -Creatinine improved with hydration, resolution of sepsis  Delirium, toxic encephalopathy  -Required PRN Haldol and restraints this admission, now improving  Dementia -Continue donezepil  HTN/ dyslipidemia.  -Restarted metoprolol  DVT prophylaxis: Enoxaparin   Code Status:   Full   Family Communication:  No family at bedside, will update.   Remains inpatient appropriate because:IV treatments appropriate due to intensity of illness or inability to take PO   Dispo: The patient is from: [facility] independent [facility]              Anticipated d/c is to: Back to [facility] screens tomorrow              Patient currently is not medically stable to d/c.   Difficult to place patient No   Consultants:   Urology   IR   Procedures:   Left nephrostomy tube   Antimicrobials:  Ceftriaxone high dose.   Subjective: -Feels okay, sitting up in bed, eating breakfast, no specific complaints  Objective: Vitals:   05/01/21 1305 05/01/21 1342 05/01/21 2046 05/02/21 0452  BP: 135/76  (!) 138/51 (!) 145/51  Pulse: 80 72 63 66  Resp: 14  16 16   Temp: 98.2 F (36.8 C)  98.6 F (37 C) 98.1 F (36.7 C)  TempSrc: Oral  Oral Oral  SpO2:   90% 95%  Weight:        Intake/Output Summary (Last 24 hours) at 05/02/2021 1151 Last data filed at 05/02/2021 05/04/2021 Gross per 24 hour  Intake 360 ml  Output 550 ml  Net -190 ml   Filed Weights   04/27/21 1212  Weight: 61.2 kg    Examination:   General: Elderly pleasant female sitting up in bed, awake alert oriented to self and partly to place HEENT: No JVD, positive pallor, no icterus CVS: S1-S2, regular rate rhythm Lungs: Decreased breath sounds to bases Abdomen: Soft, nontender, left percutaneous nephrostomy drain with clear urine, bowel sounds present  Extremities: No edema  Skin: No rashes on exposed skin  Data Reviewed: I have personally reviewed following labs and imaging studies  CBC: Recent  Labs  Lab 04/27/21 1242 04/28/21 0029 04/29/21 0610 04/30/21 0526 05/01/21 0538 05/01/21 1159  WBC 26.0* 18.5* 12.6* 12.3* 10.6* 11.2*  NEUTROABS 22.9*  --  10.8* 9.4* 7.4  --   HGB 13.5 12.2 13.1 13.3 12.1 13.0  HCT 40.1 36.4 39.1 39.9 35.6* 38.1  MCV 92.2 93.1 91.6 92.4 89.9 90.7  PLT 132* 112* 126* 122* 136*  160   Basic Metabolic Panel: Recent Labs  Lab 04/28/21 0029 04/29/21 0610 04/30/21 0526 05/01/21 0538 05/02/21 0508  NA 136 136 136 137 137  K 3.7 3.0* 3.7 3.5 3.2*  CL 109 107 107 105 106  CO2 22 23 20* 26 26  GLUCOSE 101* 103* 94 94 87  BUN 25* 19 12 12 13   CREATININE 1.32* 1.26* 1.24* 1.20* 1.12*  CALCIUM 8.5* 8.6* 8.7* 8.6* 8.5*   GFR: CrCl cannot be calculated (Unknown ideal weight.). Liver Function Tests: Recent Labs  Lab 04/27/21 1242 04/28/21 0029  AST 19 20  ALT 10 12  ALKPHOS 63 53  BILITOT 1.7* 1.3*  PROT 7.1 5.6*  ALBUMIN 3.4* 2.7*   No results for input(s): LIPASE, AMYLASE in the last 168 hours. No results for input(s): AMMONIA in the last 168 hours. Coagulation Profile: Recent Labs  Lab 04/27/21 1313 04/28/21 0029  INR 1.2 1.2   Cardiac Enzymes: No results for input(s): CKTOTAL, CKMB, CKMBINDEX, TROPONINI in the last 168 hours. BNP (last 3 results) No results for input(s): PROBNP in the last 8760 hours. HbA1C: No results for input(s): HGBA1C in the last 72 hours. CBG: No results for input(s): GLUCAP in the last 168 hours. Lipid Profile: No results for input(s): CHOL, HDL, LDLCALC, TRIG, CHOLHDL, LDLDIRECT in the last 72 hours. Thyroid Function Tests: No results for input(s): TSH, T4TOTAL, FREET4, T3FREE, THYROIDAB in the last 72 hours. Anemia Panel: No results for input(s): VITAMINB12, FOLATE, FERRITIN, TIBC, IRON, RETICCTPCT in the last 72 hours.  Scheduled Meds: . atenolol  25 mg Oral Daily  . darifenacin  7.5 mg Oral Daily  . donepezil  10 mg Oral QHS  . potassium chloride  40 mEq Oral Once  . simvastatin  20 mg Oral q1800   Continuous Infusions: . cefTRIAXone (ROCEPHIN)  IV 2 g (05/02/21 0845)     LOS: 5 days    05/04/21, MD

## 2021-05-03 ENCOUNTER — Other Ambulatory Visit (HOSPITAL_COMMUNITY): Payer: Self-pay

## 2021-05-03 LAB — BASIC METABOLIC PANEL
Anion gap: 4 — ABNORMAL LOW (ref 5–15)
BUN: 13 mg/dL (ref 8–23)
CO2: 29 mmol/L (ref 22–32)
Calcium: 8.9 mg/dL (ref 8.9–10.3)
Chloride: 109 mmol/L (ref 98–111)
Creatinine, Ser: 1.03 mg/dL — ABNORMAL HIGH (ref 0.44–1.00)
GFR, Estimated: 54 mL/min — ABNORMAL LOW (ref >60)
Glucose, Bld: 85 mg/dL (ref 70–99)
Potassium: 3.7 mmol/L (ref 3.5–5.1)
Sodium: 142 mmol/L (ref 135–145)

## 2021-05-03 LAB — CBC
HCT: 36 % (ref 36.0–46.0)
Hemoglobin: 12.2 g/dL (ref 12.0–15.0)
MCH: 31.2 pg (ref 26.0–34.0)
MCHC: 33.9 g/dL (ref 30.0–36.0)
MCV: 92.1 fL (ref 80.0–100.0)
Platelets: 216 10*3/uL (ref 150–400)
RBC: 3.91 MIL/uL (ref 3.87–5.11)
RDW: 13.9 % (ref 11.5–15.5)
WBC: 10.2 10*3/uL (ref 4.0–10.5)
nRBC: 0 % (ref 0.0–0.2)

## 2021-05-03 LAB — AEROBIC/ANAEROBIC CULTURE W GRAM STAIN (SURGICAL/DEEP WOUND)

## 2021-05-03 MED ORDER — CIPROFLOXACIN HCL 500 MG PO TABS
500.0000 mg | ORAL_TABLET | Freq: Two times a day (BID) | ORAL | Status: DC
  Administered 2021-05-03: 500 mg via ORAL
  Filled 2021-05-03: qty 1

## 2021-05-03 MED ORDER — ATENOLOL 50 MG PO TABS: 25.0000 mg | ORAL_TABLET | Freq: Every day | ORAL | Status: DC

## 2021-05-03 MED ORDER — CIPROFLOXACIN HCL 500 MG PO TABS
500.0000 mg | ORAL_TABLET | Freq: Two times a day (BID) | ORAL | 0 refills | Status: DC
  Filled 2021-05-03: qty 8, 4d supply, fill #0

## 2021-05-03 NOTE — TOC Transition Note (Signed)
Transition of Care [facility]) - CM/SW Discharge Note   Patient Details  Name: Annette Vaughn MRN: [id number] Date of Birth: 12-Jul-1935  Transition of Care [facility]) CM/SW Contact:  Bartholome Bill, RN Phone Number: 05/03/2021, 2:34 PM   Clinical Narrative:    Pt to dc back to [facility] today. 24hr caregivers have been confirmed with her POA Dorian. Dorian has consented to have our [facility] transport services take pt home. Spoke with transport department to alert of pt need for medical transport, not uber or ambulance. RW ordered for pt to take home with her. Caregiver Shamara to meet pt at her apartment. RN to call Mahlon Gammon once transport team has alerted [place] of time transport will arrive to hospital.  HHPT orders faxed to [facility] for pt to receive PT at her independent living facility.

## 2021-05-03 NOTE — Discharge Summary (Signed)
Physician Discharge Summary  Annette Vaughn ZOX:[id number] DOB: 02-Dec-1935 DOA: 04/27/2021  PCP: No primary care provider on file.  Admit date: 04/27/2021 Discharge date: 05/03/2021  Time spent: 35 minutes  Recommendations for Outpatient Follow-up:  Urology Dr. Karoline Caldwell in 10 days, needs definitive management of large left staghorn calculus with pyelonephritis  Discharge Diagnoses:  Principal Problem:   Acute pyelonephritis   Severe Sepsis (HCC)   Staghorn calculus   AKI (acute kidney injury) (HCC)   Essential hypertension   Hyperlipemia   Dementia without behavioral disturbance (HCC)   Hyponatremia   Cholelithiases   Discharge Condition: Stable  Diet recommendation: Low-sodium  Filed Weights   04/27/21 1212  Weight: 61.2 kg    History of present illness:  Annette Vaughn with history of hypertension, dementia, dyslipidemia, nephrolithiasis resident of [facility] was admitted on 5/21 with severe sepsis, left pyelonephritis and obstructive uropathy from a staghorn calculus. -Renal CT with large staghorn calculus in the left renal pelvis with dilatation of the upper pole calyces and contour abnormality with low attenuation in the upper lobe. Suspected intrarenal collection in the setting of large staghorn calculus with marked perinephric stranding and associated hydronephrosis.  Hospital Course:   Severe sepsis due to left pyelonephritis in the setting of staghorn calculus/ obstructive uropathy,  -Clinically improved, sepsis physiology has resolved -Treated with IV ceftriaxone, urology was consulted, recommended IR evaluation and percutaneous nephrostomy drain which was completed in interventional radiology on 5/22 -Blood and urine cultures grew Klebsiella, MIC for cefazolin was 16 hence remained on ceftriaxone while inpatient and then transition to oral ciprofloxacin which Klebsiella was sensitive to. -Continue antibiotics for 8 more days to complete 14 day  therapy -She will discharge with percutaneous nephrostomy drain today -Needs close follow-up with urology Dr. Charlestine Massed for definitive management of her stone, percutaneous nephrolithotomy being considered as outpatient -PT OT eval completed, home health PT was recommended and set up at discharge  AKI on CKD stage 3a. hyponatremia. -Creatinine improved with hydration, resolution of sepsis  Delirium, toxic encephalopathy  -Required Haldol as needed and restraints earlier this admission for delirium -Now resolved  Dementia -Continue donezepil  HTN/ dyslipidemia.  -Restarted atenolol, dose decreased due to bradycardia  Consultants:   Urology   IR   Procedures:   Left percutaneous nephrostomy tube in IR on 5/22  Discharge Exam: Vitals:   05/02/21 2022 05/03/21 0430  BP: (!) 150/65 (!) 160/75  Pulse: 62 (!) 44  Resp: 14 18  Temp: 98.2 F (36.8 C) (!) 97.5 F (36.4 C)  SpO2: 99% 97%    General: AAOx2 memory, cognitive deficits Cardiovascular: S1-S2, regular rate rhythm Respiratory: Clear Abdomen: Soft, nontender, bowel sounds present, left percutaneous nephrostomy Extremities: No edema  Discharge Instructions    Allergies as of 05/03/2021   No Known Allergies     Medication List    TAKE these medications   acetaminophen 325 MG tablet Commonly known as: TYLENOL Take 650 mg by mouth every 6 (six) hours as needed for mild pain.   atenolol 50 MG tablet Commonly known as: TENORMIN Take 0.5 tablets (25 mg total) by mouth daily. What changed: how much to take   ciprofloxacin 500 MG tablet Commonly known as: CIPRO Take 1 tablet (500 mg total) by mouth 2 (two) times daily.   donepezil 10 MG tablet Commonly known as: ARICEPT Take 10 mg by mouth at bedtime.   simvastatin 20 MG tablet Commonly known as: ZOCOR Take 20 mg by mouth at bedtime.  solifenacin 5 MG tablet Commonly known as: VESICARE Take 5 mg by mouth at bedtime.      No Known  Allergies  Follow-up Information    Annette Agee, MD. Go in 10 day(s).   Specialty: Urology Contact information: [address] [place] [address] [phone]                The results of significant diagnostics from this hospitalization (including imaging, microbiology, ancillary and laboratory) are listed below for reference.    Significant Diagnostic Studies: CT ABDOMEN PELVIS WO CONTRAST  Result Date: 04/27/2021 CLINICAL DATA:  Pain, acute nonlocalized abdominal pain in a 86 year old female. EXAM: CT ABDOMEN AND PELVIS WITHOUT CONTRAST TECHNIQUE: Multidetector CT imaging of the abdomen and pelvis was performed following the standard protocol without IV contrast. COMPARISON:  None FINDINGS: Lower chest: Basilar atelectasis. No substantial pleural effusion. Signs of mitral annular and aortic valvular calcifications. Heart is incompletely imaged. Hepatobiliary: Liver with smooth contours.  Cholelithiasis. Pancreas: Pancreas with normal contour no sign of inflammation. Spleen: Normal size and contour. Adrenals/Urinary Tract: Adrenal glands are normal. Moderate LEFT upper pole hydronephrosis in the setting of large "staghorn type calculus at the LEFT UPJ extending into lower pole calices. This calculus measures 2.6 x 2.6 x 1.6 cm. There is moderate to marked LEFT-sided perinephric stranding. Small low-density area in the upper pole the LEFT kidney may represent a cyst or small intrarenal collection. The ureter beyond this large calculus is decompressed. The urinary bladder is unremarkable. RIGHT kidney with cortical scarring. Stomach/Bowel: Small hiatal hernia. Small bowel is normal caliber. Some fluid-filled loops throughout the abdomen, no signs of adjacent stranding. Liquid stool in the colon. Minimal diverticular changes in the sigmoid. Vascular/Lymphatic: Calcified atheromatous plaque in the abdominal aorta. Smooth contour of the IVC. There is no gastrohepatic or  hepatoduodenal ligament lymphadenopathy. No retroperitoneal or mesenteric lymphadenopathy. No pelvic sidewall lymphadenopathy. Reproductive: Post hysterectomy. Other: No free air.  No ascites. Musculoskeletal: No acute bone finding. No destructive bone process. Spinal degenerative changes. Osteopenia. IMPRESSION: 1. Large staghorn calculus in the LEFT renal pelvis with dilation of upper pole calyces and contour abnormality with low attenuation in the upper pole. Contour abnormality could represent a cyst but is suspicious for small intrarenal collection and or developing XGP in the setting of large staghorn calculus with marked perinephric stranding and associated hydronephrosis. 2. Urologic consultation at or close follow-up may be warranted along with correlation with urine studies for further evaluation. 3. Cholelithiasis with large gallstone in the gallbladder measuring approximately 2.5 cm greatest dimension. 4. Small hiatal hernia. 5. Minimal diverticular changes in the sigmoid. 6. Aortic atherosclerosis. Aortic Atherosclerosis (ICD10-I70.0). Electronically Signed   By: Donzetta Kohut M.D.   On: 04/27/2021 16:35   DG Chest Portable 1 View  Result Date: 04/27/2021 CLINICAL DATA:  86 year old female with fever. EXAM: PORTABLE CHEST 1 VIEW COMPARISON:  None. FINDINGS: The cardiomediastinal silhouette is unremarkable. There is no evidence of focal airspace disease, pulmonary edema, suspicious pulmonary nodule/mass, pleural effusion, or pneumothorax. No acute bony abnormalities are identified. IMPRESSION: No active disease. Electronically Signed   By: Harmon Pier M.D.   On: 04/27/2021 14:09   IR NEPHROSTOMY PLACEMENT LEFT  Result Date: 04/28/2021 INDICATION: 86 year old female with urosepsis secondary to an obstructing and infected staghorn within the left renal pelvis. She presents for percutaneous nephrostomy tube placement. EXAM: IR NEPHROSTOMY PLACEMENT LEFT COMPARISON:  None. MEDICATIONS: Rocephin 2 g  IV ANESTHESIA/SEDATION: Fentanyl 25 mcg IV; Versed 1  mg IV Moderate Sedation Time:  12 minutes The patient was continuously monitored during the procedure by the interventional radiology nurse under my direct supervision. CONTRAST:  10mL OMNIPAQUE IOHEXOL 300 MG/ML SOLN - administered into the collecting system(s) FLUOROSCOPY TIME:  Fluoroscopy Time: 1 minutes 6 seconds (42 mGy). COMPLICATIONS: None immediate. TECHNIQUE: The procedure, risks, benefits, and alternatives were explained to the patient. Questions regarding the procedure were encouraged and answered. The patient understands and consents to the procedure. The left flank was prepped with chlorhexidine in a sterile fashion, and a sterile drape was applied covering the operative field. A sterile gown and sterile gloves were used for the procedure. Local anesthesia was provided with 1% Lidocaine. The left flank was interrogated with ultrasound and the left kidney identified. The kidney is hydronephrotic. A suitable access site on the skin overlying the lower pole, posterior calix was identified. After local mg anesthesia was achieved, a small skin nick was made with an 11 blade scalpel. A 21 gauge Accustick needle was then advanced under direct sonographic guidance into the lower pole of the left kidney. A 0.018 inch wire was advanced under fluoroscopic guidance into the left renal collecting system. The Accustick sheath was then advanced over the wire and a 0.018 system exchanged for a 0.035 system. Gentle hand injection of contrast material confirms placement of the sheath within the renal collecting system. There is moderate hydronephrosis and a large staghorn calculus primarily involving the renal pelvis and the lower pole and interpolar infundibulum. The tract from the scan into the renal collecting system was then dilated serially to 10-French. A 10-French Cook all-purpose drain was then placed and positioned under fluoroscopic guidance. The locking  loop is well formed within the upper pole infundibulum in the most dilated portion of the collecting system. The catheter was secured to the skin with 2-0 Prolene and a sterile bandage was placed. Catheter was left to gravity bag drainage. IMPRESSION: Successful placement of a left 10 French percutaneous nephrostomy tube. Electronically Signed   By: Malachy Moan M.D.   On: 04/28/2021 10:07    Microbiology: Recent Results (from the past 240 hour(s))  Culture, blood (routine x 2)     Status: Abnormal   Collection Time: 04/27/21  1:00 PM   Specimen: BLOOD  Result Value Ref Range Status   Specimen Description   Final    BLOOD RIGHT ARM Performed at [facility], [address], [place], [place] [address]    Special Requests   Final    BOTTLES DRAWN AEROBIC AND ANAEROBIC Blood Culture results may not be optimal due to an inadequate volume of blood received in culture bottles Performed at [facility], [address] [place], [place] [address]    Culture  Setup Time   Final    GRAM NEGATIVE RODS IN BOTH AEROBIC AND ANAEROBIC BOTTLES CRITICAL RESULT CALLED TO, READ BACK BY AND VERIFIED WITH: PHARM D M.MICHAEL ON [id number] AT 0753 BY E.PARRISH Performed at [facility], [address], [place], [place] [address]    Culture KLEBSIELLA PNEUMONIAE (A)  Final   Report Status 04/30/2021 FINAL  Final   Organism ID, Bacteria KLEBSIELLA PNEUMONIAE  Final      Susceptibility   Klebsiella pneumoniae - MIC*    AMPICILLIN >=32 RESISTANT Resistant     CEFAZOLIN 16 SENSITIVE Sensitive     CEFEPIME <=0.12 SENSITIVE Sensitive     CEFTAZIDIME <=1 SENSITIVE Sensitive     CEFTRIAXONE <=0.25 SENSITIVE  Sensitive     CIPROFLOXACIN <=0.25 SENSITIVE Sensitive     GENTAMICIN <=1 SENSITIVE Sensitive     IMIPENEM <=0.25 SENSITIVE Sensitive     TRIMETH/SULFA >=320 RESISTANT Resistant     AMPICILLIN/SULBACTAM >=32 RESISTANT Resistant     PIP/TAZO 32 INTERMEDIATE  Intermediate     * KLEBSIELLA PNEUMONIAE  Blood Culture ID Panel (Reflexed)     Status: Abnormal   Collection Time: 04/27/21  1:00 PM  Result Value Ref Range Status   Enterococcus faecalis NOT DETECTED NOT DETECTED Final   Enterococcus Faecium NOT DETECTED NOT DETECTED Final   Listeria monocytogenes NOT DETECTED NOT DETECTED Final   Staphylococcus species NOT DETECTED NOT DETECTED Final   Staphylococcus aureus (BCID) NOT DETECTED NOT DETECTED Final   Staphylococcus epidermidis NOT DETECTED NOT DETECTED Final   Staphylococcus lugdunensis NOT DETECTED NOT DETECTED Final   Streptococcus species NOT DETECTED NOT DETECTED Final   Streptococcus agalactiae NOT DETECTED NOT DETECTED Final   Streptococcus pneumoniae NOT DETECTED NOT DETECTED Final   Streptococcus pyogenes NOT DETECTED NOT DETECTED Final   A.calcoaceticus-baumannii NOT DETECTED NOT DETECTED Final   Bacteroides fragilis NOT DETECTED NOT DETECTED Final   Enterobacterales DETECTED (A) NOT DETECTED Final    Comment: Enterobacterales represent a large order of gram negative bacteria, not a single organism. CRITICAL RESULT CALLED TO, READ BACK BY AND VERIFIED WITH: PHARM D M.MICHAEL ON [id number] AT 0753 BY E.PARRISH    Enterobacter cloacae complex NOT DETECTED NOT DETECTED Final   Escherichia coli NOT DETECTED NOT DETECTED Final   Klebsiella aerogenes NOT DETECTED NOT DETECTED Final   Klebsiella oxytoca NOT DETECTED NOT DETECTED Final   Klebsiella pneumoniae DETECTED (A) NOT DETECTED Final    Comment: CRITICAL RESULT CALLED TO, READ BACK BY AND VERIFIED WITH: PHARM D M.MICHAEL ON [id number] AT 0753 BY E.PARRISH    Proteus species NOT DETECTED NOT DETECTED Final   Salmonella species NOT DETECTED NOT DETECTED Final   Serratia marcescens NOT DETECTED NOT DETECTED Final   Haemophilus influenzae NOT DETECTED NOT DETECTED Final   Neisseria meningitidis NOT DETECTED NOT DETECTED Final   Pseudomonas aeruginosa NOT DETECTED NOT DETECTED  Final   Stenotrophomonas maltophilia NOT DETECTED NOT DETECTED Final   Candida albicans NOT DETECTED NOT DETECTED Final   Candida auris NOT DETECTED NOT DETECTED Final   Candida glabrata NOT DETECTED NOT DETECTED Final   Candida krusei NOT DETECTED NOT DETECTED Final   Candida parapsilosis NOT DETECTED NOT DETECTED Final   Candida tropicalis NOT DETECTED NOT DETECTED Final   Cryptococcus neoformans/gattii NOT DETECTED NOT DETECTED Final   CTX-M ESBL NOT DETECTED NOT DETECTED Final   Carbapenem resistance IMP NOT DETECTED NOT DETECTED Final   Carbapenem resistance KPC NOT DETECTED NOT DETECTED Final   Carbapenem resistance NDM NOT DETECTED NOT DETECTED Final   Carbapenem resist OXA 48 LIKE NOT DETECTED NOT DETECTED Final   Carbapenem resistance VIM NOT DETECTED NOT DETECTED Final    Comment: Performed at [facility], [address], [place], [place] [address]  Resp Panel by RT-PCR (Flu A&B, Covid) Nasopharyngeal Swab     Status: None   Collection Time: 04/27/21  1:01 PM   Specimen: Nasopharyngeal Swab; Nasopharyngeal(NP) swabs in vial transport medium  Result Value Ref Range Status   SARS Coronavirus 2 by RT PCR NEGATIVE NEGATIVE Final    Comment: (NOTE) SARS-CoV-2 target nucleic acids are NOT DETECTED.  The SARS-CoV-2 RNA is generally detectable in upper respiratory specimens during the acute phase of  infection. The lowest concentration of SARS-CoV-2 viral copies this assay can detect is 138 copies/mL. A negative result does not preclude SARS-Cov-2 infection and should not be used as the sole basis for treatment or other patient management decisions. A negative result may occur with  improper specimen collection/handling, submission of specimen other than nasopharyngeal swab, presence of viral mutation(s) within the areas targeted by this assay, and inadequate number of viral copies(<138 copies/mL). A negative result must be combined with clinical observations, patient  history, and epidemiological information. The expected result is Negative.  Fact Sheet for Patients:  BloggerCourse.com  Fact Sheet for Healthcare Providers:  SeriousBroker.it  This test is no t yet approved or cleared by the [place] FDA and  has been authorized for detection and/or diagnosis of SARS-CoV-2 by FDA under an Emergency Use Authorization (EUA). This EUA will remain  in effect (meaning this test can be used) for the duration of the COVID-19 declaration under Section 564(b)(1) of the Act, 21 U.S.C.section 360bbb-3(b)(1), unless the authorization is terminated  or revoked sooner.       Influenza A by PCR NEGATIVE NEGATIVE Final   Influenza B by PCR NEGATIVE NEGATIVE Final    Comment: (NOTE) The Xpert Xpress SARS-CoV-2/FLU/RSV plus assay is intended as an aid in the diagnosis of influenza from Nasopharyngeal swab specimens and should not be used as a sole basis for treatment. Nasal washings and aspirates are unacceptable for Xpert Xpress SARS-CoV-2/FLU/RSV testing.  Fact Sheet for Patients: BloggerCourse.com  Fact Sheet for Healthcare Providers: SeriousBroker.it  This test is not yet approved or cleared by the [place] FDA and has been authorized for detection and/or diagnosis of SARS-CoV-2 by FDA under an Emergency Use Authorization (EUA). This EUA will remain in effect (meaning this test can be used) for the duration of the COVID-19 declaration under Section 564(b)(1) of the Act, 21 U.S.C. section 360bbb-3(b)(1), unless the authorization is terminated or revoked.  Performed at [facility], [address], [place], [place] [address]   Culture, blood (routine x 2)     Status: Abnormal   Collection Time: 04/27/21  1:05 PM   Specimen: BLOOD  Result Value Ref Range Status   Specimen Description   Final    BLOOD LEFT ARM Performed at  [facility], [address], [place], [place] [address]    Special Requests   Final    BOTTLES DRAWN AEROBIC AND ANAEROBIC Blood Culture results may not be optimal due to an excessive volume of blood received in culture bottles Performed at [facility], [address], [place], [place] [address]    Culture  Setup Time   Final    GRAM NEGATIVE RODS IN BOTH AEROBIC AND ANAEROBIC BOTTLES    Culture (A)  Final    KLEBSIELLA PNEUMONIAE SUSCEPTIBILITIES PERFORMED ON PREVIOUS CULTURE WITHIN THE LAST 5 DAYS. Performed at [facility], [address] [place], [place] [address]    Report Status 04/30/2021 FINAL  Final  Urine culture     Status: Abnormal   Collection Time: 04/27/21  3:00 PM   Specimen: In/Out Cath Urine  Result Value Ref Range Status   Specimen Description   Final    IN/OUT CATH URINE Performed at [facility], [address] [place], [place] [address]    Special Requests   Final    NONE Performed at [facility], [address] [place], [place] [address]    Culture (  A)  Final    >=100,000 COLONIES/mL Multiple bacterial morphotypes present, none predominant. Suggest appropriate recollection if clinically indicated.   Report Status 04/29/2021 FINAL  Final  Aerobic/Anaerobic Culture (surgical/deep wound)     Status: None (Preliminary result)   Collection Time: 04/28/21  9:10 AM   Specimen: Urine, Random  Result Value Ref Range Status   Specimen Description   Final    URINE, RANDOM Performed at [facility], [address] [place], [place] [address]    Special Requests   Final    ASPIRATION FROM OBSTRUCTED KIDNEY Performed at [facility], [address] [place], [place] [id number]    Gram Stain   Final    WBC PRESENT,BOTH PMN AND MONONUCLEAR GRAM NEGATIVE RODS CYTOSPIN SMEAR Performed at [facility], [address] [place],  [place] [id number]    Culture   Final    RARE KLEBSIELLA PNEUMONIAE NO ANAEROBES ISOLATED; CULTURE IN PROGRESS FOR 5 DAYS    Report Status PENDING  Incomplete   Organism ID, Bacteria KLEBSIELLA PNEUMONIAE  Final      Susceptibility   Klebsiella pneumoniae - MIC*    AMPICILLIN >=32 RESISTANT Resistant     CEFAZOLIN <=4 SENSITIVE Sensitive     CEFEPIME <=0.12 SENSITIVE Sensitive     CEFTRIAXONE <=0.25 SENSITIVE Sensitive     CIPROFLOXACIN <=0.25 SENSITIVE Sensitive     GENTAMICIN <=1 SENSITIVE Sensitive     IMIPENEM <=0.25 SENSITIVE Sensitive     NITROFURANTOIN 64 INTERMEDIATE Intermediate     TRIMETH/SULFA >=320 RESISTANT Resistant     AMPICILLIN/SULBACTAM >=32 RESISTANT Resistant     PIP/TAZO 64 INTERMEDIATE Intermediate     * RARE KLEBSIELLA PNEUMONIAE     Labs: Basic Metabolic Panel: Recent Labs  Lab 04/29/21 0610 04/30/21 0526 05/01/21 0538 05/02/21 0508 05/03/21 0606  NA 136 136 137 137 142  K 3.0* 3.7 3.5 3.2* 3.7  CL 107 107 105 106 109  CO2 23 20* 26 26 29   GLUCOSE 103* 94 94 87 85  BUN 19 12 12 13 13   CREATININE 1.26* 1.24* 1.20* 1.12* 1.03*  CALCIUM 8.6* 8.7* 8.6* 8.5* 8.9   Liver Function Tests: Recent Labs  Lab 04/27/21 1242 04/28/21 0029  AST 19 20  ALT 10 12  ALKPHOS 63 53  BILITOT 1.7* 1.3*  PROT 7.1 5.6*  ALBUMIN 3.4* 2.7*   No results for input(s): LIPASE, AMYLASE in the last 168 hours. No results for input(s): AMMONIA in the last 168 hours. CBC: Recent Labs  Lab 04/27/21 1242 04/28/21 0029 04/29/21 0610 04/30/21 0526 05/01/21 0538 05/01/21 1159 05/03/21 0606  WBC 26.0*   < > 12.6* 12.3* 10.6* 11.2* 10.2  NEUTROABS 22.9*  --  10.8* 9.4* 7.4  --   --   HGB 13.5   < > 13.1 13.3 12.1 13.0 12.2  HCT 40.1   < > 39.1 39.9 35.6* 38.1 36.0  MCV 92.2   < > 91.6 92.4 89.9 90.7 92.1  PLT 132*   < > 126* 122* 136* 160 216   < > = values in this interval not displayed.   Cardiac Enzymes: No results for input(s): CKTOTAL, CKMB, CKMBINDEX, TROPONINI  in the last 168 hours. BNP: BNP (last 3 results) No results for input(s): BNP in the last 8760 hours.  ProBNP (last 3 results) No results for input(s): PROBNP in the last 8760 hours.  CBG: No results for input(s): GLUCAP in the last 168 hours.  Signed:  Zannie Cove MD.  [facility] 05/03/2021, 10:52 AM

## 2021-05-03 NOTE — Progress Notes (Signed)
  Subjective: Patient without complaints.  Has been afebrile renal function normalized.  On Cipro for Klebsiella positive blood culture.  Agree with discharge back to facility with plan for follow-up in 10 days as outpatient for definitive scheduling of probable PCNL.  Objective: Vital signs in last 24 hours: Temp:  [97.5 F (36.4 C)-98.7 F (37.1 C)] 97.5 F (36.4 C) (05/27 0430) Pulse Rate:  [44-62] 44 (05/27 0430) Resp:  [14-18] 18 (05/27 0430) BP: (122-160)/(40-75) 160/75 (05/27 0430) SpO2:  [97 %-99 %] 97 % (05/27 0430)  Intake/Output from previous day: 05/26 0701 - 05/27 0700 In: 480 [P.O.:480] Out: 150 [Urine:150] Intake/Output this shift: Total I/O In: 240 [P.O.:240] Out: 350 [Urine:350]   Lab Results: Recent Labs    05/01/21 0538 05/01/21 1159 05/03/21 0606  HGB 12.1 13.0 12.2  HCT 35.6* 38.1 36.0   BMET Recent Labs    05/02/21 0508 05/03/21 0606  NA 137 142  K 3.2* 3.7  CL 106 109  CO2 26 29  GLUCOSE 87 85  BUN 13 13  CREATININE 1.12* 1.03*  CALCIUM 8.5* 8.9     Studies/Results: No results found.  Assessment/Plan: Left renal calculus status post left percutaneous nephrostomy and urosepsis Plan/recommendation.  Okay to discharge home from GU standpoint with outpatient follow-up in 10 days.  Continue Cipro 500 mg twice daily in the interim.    LOS: 6 days   [name] 05/03/2021, 11:50 AM

## 2021-05-11 ENCOUNTER — Other Ambulatory Visit (HOSPITAL_COMMUNITY): Payer: Self-pay

## 2021-05-17 ENCOUNTER — Emergency Department (HOSPITAL_COMMUNITY): Payer: Medicare Other

## 2021-05-17 ENCOUNTER — Encounter (HOSPITAL_COMMUNITY): Payer: Self-pay

## 2021-05-17 ENCOUNTER — Emergency Department (HOSPITAL_COMMUNITY)
Admission: EM | Admit: 2021-05-17 | Discharge: 2021-05-17 | Disposition: A | Discharge status: Home / Self Care | Payer: Medicare Other | Attending: Emergency Medicine | Admitting: Emergency Medicine

## 2021-05-17 ENCOUNTER — Other Ambulatory Visit: Payer: Self-pay

## 2021-05-17 DIAGNOSIS — Y846 Urinary catheterization as the cause of abnormal reaction of the patient, or of later complication, without mention of misadventure at the time of the procedure: Secondary | ICD-10-CM | POA: Diagnosis not present

## 2021-05-17 DIAGNOSIS — N200 Calculus of kidney: Secondary | ICD-10-CM | POA: Insufficient documentation

## 2021-05-17 DIAGNOSIS — I10 Essential (primary) hypertension: Secondary | ICD-10-CM | POA: Insufficient documentation

## 2021-05-17 DIAGNOSIS — F0390 Unspecified dementia without behavioral disturbance: Secondary | ICD-10-CM | POA: Insufficient documentation

## 2021-05-17 DIAGNOSIS — T83022A Displacement of nephrostomy catheter, initial encounter: Secondary | ICD-10-CM | POA: Diagnosis present

## 2021-05-17 LAB — CBC WITH DIFFERENTIAL/PLATELET
Abs Immature Granulocytes: 0.02 10*3/uL (ref 0.00–0.07)
Basophils Absolute: 0.1 10*3/uL (ref 0.0–0.1)
Basophils Relative: 1 %
Eosinophils Absolute: 0.2 10*3/uL (ref 0.0–0.5)
Eosinophils Relative: 3 %
HCT: 40.4 % (ref 36.0–46.0)
Hemoglobin: 12.8 g/dL (ref 12.0–15.0)
Immature Granulocytes: 0 %
Lymphocytes Relative: 29 %
Lymphs Abs: 2.3 10*3/uL (ref 0.7–4.0)
MCH: 29.9 pg (ref 26.0–34.0)
MCHC: 31.7 g/dL (ref 30.0–36.0)
MCV: 94.4 fL (ref 80.0–100.0)
Monocytes Absolute: 0.8 10*3/uL (ref 0.1–1.0)
Monocytes Relative: 9 %
Neutro Abs: 4.6 10*3/uL (ref 1.7–7.7)
Neutrophils Relative %: 58 %
Platelets: 271 10*3/uL (ref 150–400)
RBC: 4.28 MIL/uL (ref 3.87–5.11)
RDW: 14.6 % (ref 11.5–15.5)
WBC: 7.9 10*3/uL (ref 4.0–10.5)
nRBC: 0 % (ref 0.0–0.2)

## 2021-05-17 LAB — LIPASE, BLOOD: Lipase: 37 U/L (ref 11–51)

## 2021-05-17 LAB — COMPREHENSIVE METABOLIC PANEL
ALT: 12 U/L (ref 0–44)
AST: 19 U/L (ref 15–41)
Albumin: 3.9 g/dL (ref 3.5–5.0)
Alkaline Phosphatase: 75 U/L (ref 38–126)
Anion gap: 9 (ref 5–15)
BUN: 26 mg/dL — ABNORMAL HIGH (ref 8–23)
CO2: 24 mmol/L (ref 22–32)
Calcium: 9.6 mg/dL (ref 8.9–10.3)
Chloride: 106 mmol/L (ref 98–111)
Creatinine, Ser: 1.62 mg/dL — ABNORMAL HIGH (ref 0.44–1.00)
GFR, Estimated: 31 mL/min — ABNORMAL LOW (ref >60)
Glucose, Bld: 99 mg/dL (ref 70–99)
Potassium: 4.5 mmol/L (ref 3.5–5.1)
Sodium: 139 mmol/L (ref 135–145)
Total Bilirubin: 0.4 mg/dL (ref 0.3–1.2)
Total Protein: 7.5 g/dL (ref 6.5–8.1)

## 2021-05-17 LAB — URINALYSIS, ROUTINE W REFLEX MICROSCOPIC
Bacteria, UA: NONE SEEN
Bilirubin Urine: NEGATIVE
Glucose, UA: NEGATIVE mg/dL
Hgb urine dipstick: NEGATIVE
Ketones, ur: NEGATIVE mg/dL
Nitrite: NEGATIVE
Protein, ur: 30 mg/dL — AB
Specific Gravity, Urine: 1.018 (ref 1.005–1.030)
pH: 5 (ref 5.0–8.0)

## 2021-05-17 LAB — LACTIC ACID, PLASMA: Lactic Acid, Venous: 1.3 mmol/L (ref 0.5–1.9)

## 2021-05-17 LAB — PROTIME-INR
INR: 1 (ref 0.8–1.2)
Prothrombin Time: 12.7 seconds (ref 11.4–15.2)

## 2021-05-17 MED ORDER — CIPROFLOXACIN HCL 500 MG PO TABS
500.0000 mg | ORAL_TABLET | Freq: Once | ORAL | Status: AC
  Administered 2021-05-17: 500 mg via ORAL
  Filled 2021-05-17: qty 1

## 2021-05-17 NOTE — ED Provider Notes (Signed)
[facility]-EMERGENCY DEPT Provider Note   CSN: [id number] Arrival date & time: 05/17/21  1620     History No chief complaint on file.   Annette Vaughn is a 86 y.o. female.  HPI Patient is brought to the emergency department because her percutaneous nephrostomy tube is no longer in place.  She has a new caregiver who started yesterday.  Her caregiver went to check on the tube and identified that it is not there anymore and the site is healed over.  The patient has no idea what happened and does not really recall that there was ever a tube in place.  She reports she has been feeling pretty good.  She reports he has been going to the bathroom what she considers to be about normal.  She does endorse that she has had some burning with urination.  She does not think she has been feeling sick.  Her caregiver reports that she is eating well.  They have not documented any fever.    No past medical history on file.  Patient Active Problem List   Diagnosis Date Noted   Sepsis (HCC) 04/27/2021   Staghorn calculus 04/27/2021   Acute pyelonephritis 04/27/2021   AKI (acute kidney injury) (HCC) 04/27/2021   Essential hypertension 04/27/2021   Hyperlipemia 04/27/2021   Dementia without behavioral disturbance (HCC) 04/27/2021   Hyponatremia 04/27/2021   Thrombocytopenia (HCC) 04/27/2021   Cholelithiases 04/27/2021    Past Surgical History:  Procedure Laterality Date   IR NEPHROSTOMY PLACEMENT LEFT  04/28/2021     OB History   No obstetric history on file.     No family history on file.  Social History   Tobacco Use   Smoking status: Never   Smokeless tobacco: Never  Vaping Use   Vaping Use: Never used  Substance Use Topics   Alcohol use: Never   Drug use: Never    Home Medications Prior to Admission medications   Medication Sig Start Date End Date Taking? Authorizing Provider  acetaminophen (TYLENOL) 325 MG tablet Take 650 mg by mouth every 6 (six) hours  as needed for mild pain.    [provider]  atenolol (TENORMIN) 50 MG tablet Take 0.5 tablets (25 mg total) by mouth daily. 05/03/21   Zannie Cove, MD  ciprofloxacin (CIPRO) 500 MG tablet Take 1 tablet (500 mg total) by mouth 2 (two) times daily. 05/03/21   Zannie Cove, MD  donepezil (ARICEPT) 10 MG tablet Take 10 mg by mouth at bedtime.    [provider]  simvastatin (ZOCOR) 20 MG tablet Take 20 mg by mouth at bedtime.    [provider]  solifenacin (VESICARE) 5 MG tablet Take 5 mg by mouth at bedtime.    [provider]    Allergies    Patient has no known allergies.  Review of Systems   Review of Systems 10 systems reviewed and negative except as per HPI Physical Exam Updated Vital Signs BP (!) 129/93 (BP Location: Left Arm)   Pulse 63   Temp 98.8 F (37.1 C) (Oral)   Resp 18   Ht 5\' 8"  (1.727 m)   Wt 61.2 kg   LMP  (LMP Unknown)   SpO2 96%   BMI 20.53 kg/m   Physical Exam Constitutional:      Comments: Patient is alert and nontoxic.  No respiratory distress.  HENT:     Head: Normocephalic and atraumatic.     Mouth/Throat:     Pharynx: Oropharynx  is clear.  Eyes:     Extraocular Movements: Extraocular movements intact.  Cardiovascular:     Rate and Rhythm: Normal rate and regular rhythm.  Pulmonary:     Effort: Pulmonary effort is normal.     Comments: No respiratory distress.  Slight coarse breath sounds at the right base. Abdominal:     General: There is no distension.     Palpations: Abdomen is soft.     Tenderness: There is no abdominal tenderness. There is no guarding.     Comments: Left flank has a healed scar were a nephrostomy tube was in place.  Skin is completely closed over.  No signs of secondary infection  Musculoskeletal:        General: No swelling or tenderness. Normal range of motion.     Right lower leg: No edema.     Left lower leg: No edema.  Skin:    General: Skin is warm and dry.  Neurological:      General: No focal deficit present.     Comments: Patient is alert.  She does help in following commands.  However she exhibit signs of dementia with poor recall.  No focal motor deficits.  Speech is clear    ED Results / Procedures / Treatments   Labs (all labs ordered are listed, but only abnormal results are displayed) Labs Reviewed  COMPREHENSIVE METABOLIC PANEL - Abnormal; Notable for the following components:      Result Value   BUN 26 (*)    Creatinine, Ser 1.62 (*)    GFR, Estimated 31 (*)    All other components within normal limits  URINE CULTURE  CULTURE, BLOOD (ROUTINE X 2)  CULTURE, BLOOD (ROUTINE X 2)  LIPASE, BLOOD  LACTIC ACID, PLASMA  CBC WITH DIFFERENTIAL/PLATELET  PROTIME-INR  LACTIC ACID, PLASMA  URINALYSIS, ROUTINE W REFLEX MICROSCOPIC    EKG None  Radiology CT Renal Stone Study  Result Date: 05/17/2021 CLINICAL DATA:  Lower abdominal pain. History of left renal staghorn calculus complicated by urosepsis. EXAM: CT ABDOMEN AND PELVIS WITHOUT CONTRAST TECHNIQUE: Multidetector CT imaging of the abdomen and pelvis was performed following the standard protocol without IV contrast. COMPARISON:  CT abdomen pelvis dated Apr 27, 2021. FINDINGS: Lower chest: No acute abnormality. Hepatobiliary: The liver is unremarkable. Unchanged 2.6 cm gallstone. No gallbladder wall thickening or biliary dilatation. Pancreas: Unremarkable. No pancreatic ductal dilatation or surrounding inflammatory changes. Spleen: Normal in size without focal abnormality. Adrenals/Urinary Tract: Adrenal glands are unremarkable. Unchanged right renal cortical scarring. Unchanged staghorn calculus at the left UPJ extending into the lower pole calyces. Decreased now minimal left upper pole hydronephrosis. Improved left perinephric fat stranding. Unchanged 1.6 cm simple cyst in the upper pole. The bladder is unremarkable. Stomach/Bowel: Stomach is within normal limits. Diminutive or absent appendix. No  evidence of bowel wall thickening, distention, or inflammatory changes. Vascular/Lymphatic: Aortic atherosclerosis. No enlarged abdominal or pelvic lymph nodes. Reproductive: Status post hysterectomy. No adnexal masses. Other: No abdominal wall hernia or abnormality. No abdominopelvic ascites. No pneumoperitoneum. Musculoskeletal: No acute or significant osseous findings. IMPRESSION: 1. Unchanged staghorn calculus at the left UPJ. Decreased now minimal left upper pole hydronephrosis with improved left perinephric fat stranding. 2. Unchanged cholelithiasis. 3. Aortic Atherosclerosis (ICD10-I70.0). Electronically Signed   By: Obie Dredge M.D.   On: 05/17/2021 19:37    Procedures Procedures   Medications Ordered in ED Medications  ciprofloxacin (CIPRO) tablet 500 mg (500 mg Oral Given 05/17/21 2300)    ED Course  I have reviewed the triage vital signs and the nursing notes.  Pertinent labs & imaging results that were available during my care of the patient were reviewed by me and considered in my medical decision making (see chart for details).    MDM Rules/Calculators/A&P                         Patient presents with nephrostomy tube having been removed sometime after her discharge.  Unknown when that occurred.  Nephrostomy was supposed to stay in place until follow-up with urology.  By examination, the nephrostomy tube was been gone for at least several days.  It is well-healed over at this point.  All diagnostic evaluation has been completed except the urinalysis which is very necessary for disposition.  CT scan does not show any increasing hydronephrosis.  Inflammatory changes also are improved.  Reviewed With urology Dr.Demzik. ok to continue Cipro and see Dr. Remer Macho within a few days. Careful return precautions.  Final Clinical Impression(s) / ED Diagnoses Final diagnoses:  Staghorn calculus  Nephrostomy tube displaced [facility])    Rx / DC Orders ED Discharge Orders     None         Arby Barrette, MD 05/17/21 2309

## 2021-05-17 NOTE — Discharge Instructions (Addendum)
1.  Call Dr. Michel Bickers office and make sure your appointment is scheduled as soon as possible.  You should be seen within less than a week. 2.  Continue taking ciprofloxacin 500 mg twice daily until you are seen by the urologist.  A refill has been ordered if you have run out since you are discharged from the hospital. 3.  Immediately return to emergency department if you develop a fever, pain in your back, difficulty urinating or other concerning symptoms.

## 2021-05-17 NOTE — ED Triage Notes (Signed)
Patient is a resident of [facility] section.  Caretaker went to the patient's residence to empty a left nephrostomy tube, but tube was not present. Area healed. Caretaker states she was told she was going to get the tube removed next week.  Patient confused and does not know what is going on.

## 2021-05-17 NOTE — ED Notes (Signed)
Caregiver verbalized understanding of d/c and follow up. Pt ambulatory with steady gait. Denies pain

## 2021-05-19 LAB — URINE CULTURE: Culture: <10000 — AB

## 2021-05-23 LAB — CULTURE, BLOOD (ROUTINE X 2)
Culture: NO GROWTH
Special Requests: ADEQUATE

## 2021-05-31 ENCOUNTER — Other Ambulatory Visit (HOSPITAL_COMMUNITY): Payer: Self-pay | Admitting: Urology

## 2021-05-31 DIAGNOSIS — N200 Calculus of kidney: Secondary | ICD-10-CM

## 2021-06-06 ENCOUNTER — Other Ambulatory Visit: Payer: Self-pay | Admitting: Urology

## 2021-06-12 ENCOUNTER — Other Ambulatory Visit: Payer: Self-pay | Admitting: Radiology

## 2021-06-13 ENCOUNTER — Ambulatory Visit (HOSPITAL_COMMUNITY)
Admission: RE | Admit: 2021-06-13 | Discharge: 2021-06-13 | Disposition: A | Discharge status: Home / Self Care | Payer: Medicare Other | Source: Ambulatory Visit | Attending: Urology | Admitting: Urology

## 2021-06-13 ENCOUNTER — Other Ambulatory Visit (HOSPITAL_COMMUNITY): Payer: Self-pay | Admitting: Urology

## 2021-06-13 ENCOUNTER — Encounter (HOSPITAL_COMMUNITY): Payer: Self-pay

## 2021-06-13 ENCOUNTER — Other Ambulatory Visit: Payer: Self-pay

## 2021-06-13 DIAGNOSIS — F0390 Unspecified dementia without behavioral disturbance: Secondary | ICD-10-CM | POA: Diagnosis not present

## 2021-06-13 DIAGNOSIS — Z8619 Personal history of other infectious and parasitic diseases: Secondary | ICD-10-CM | POA: Diagnosis not present

## 2021-06-13 DIAGNOSIS — N200 Calculus of kidney: Secondary | ICD-10-CM | POA: Diagnosis not present

## 2021-06-13 DIAGNOSIS — Z79899 Other long term (current) drug therapy: Secondary | ICD-10-CM | POA: Insufficient documentation

## 2021-06-13 HISTORY — PX: IR  NEPHROURETERAL CATH PLACE LEFT: IMG6065

## 2021-06-13 HISTORY — DX: Essential (primary) hypertension: I10

## 2021-06-13 HISTORY — DX: Personal history of urinary calculi: Z87.442

## 2021-06-13 LAB — CBC
HCT: 42.1 % (ref 36.0–46.0)
Hemoglobin: 14.1 g/dL (ref 12.0–15.0)
MCH: 30.9 pg (ref 26.0–34.0)
MCHC: 33.5 g/dL (ref 30.0–36.0)
MCV: 92.3 fL (ref 80.0–100.0)
Platelets: 181 10*3/uL (ref 150–400)
RBC: 4.56 MIL/uL (ref 3.87–5.11)
RDW: 14.6 % (ref 11.5–15.5)
WBC: 6.4 10*3/uL (ref 4.0–10.5)
nRBC: 0 % (ref 0.0–0.2)

## 2021-06-13 LAB — BASIC METABOLIC PANEL
Anion gap: 7 (ref 5–15)
BUN: 21 mg/dL (ref 8–23)
CO2: 26 mmol/L (ref 22–32)
Calcium: 9.9 mg/dL (ref 8.9–10.3)
Chloride: 107 mmol/L (ref 98–111)
Creatinine, Ser: 1.32 mg/dL — ABNORMAL HIGH (ref 0.44–1.00)
GFR, Estimated: 40 mL/min — ABNORMAL LOW (ref >60)
Glucose, Bld: 94 mg/dL (ref 70–99)
Potassium: 4.2 mmol/L (ref 3.5–5.1)
Sodium: 140 mmol/L (ref 135–145)

## 2021-06-13 LAB — PROTIME-INR
INR: 1 (ref 0.8–1.2)
Prothrombin Time: 12.8 seconds (ref 11.4–15.2)

## 2021-06-13 MED ORDER — FENTANYL CITRATE (PF) 100 MCG/2ML IJ SOLN
INTRAMUSCULAR | Status: AC
  Filled 2021-06-13: qty 2

## 2021-06-13 MED ORDER — LIDOCAINE-EPINEPHRINE 1 %-1:100000 IJ SOLN
INTRAMUSCULAR | Status: AC
  Filled 2021-06-13: qty 1

## 2021-06-13 MED ORDER — LIDOCAINE HCL 1 % IJ SOLN
INTRAMUSCULAR | Status: AC
  Filled 2021-06-13: qty 20

## 2021-06-13 MED ORDER — LIDOCAINE HCL (PF) 1 % IJ SOLN
INTRAMUSCULAR | Status: AC | PRN
  Administered 2021-06-13: 3 mL via INTRADERMAL
  Administered 2021-06-13: 5 mL via INTRADERMAL

## 2021-06-13 MED ORDER — FENTANYL CITRATE (PF) 100 MCG/2ML IJ SOLN
INTRAMUSCULAR | Status: AC | PRN
  Administered 2021-06-13 (×4): 25 ug via INTRAVENOUS

## 2021-06-13 MED ORDER — SODIUM CHLORIDE 0.9% FLUSH: 5.0000 mL | Freq: Three times a day (TID) | INTRAVENOUS | Status: DC

## 2021-06-13 MED ORDER — MIDAZOLAM HCL 2 MG/2ML IJ SOLN
INTRAMUSCULAR | Status: AC | PRN
  Administered 2021-06-13 (×4): 0.5 mg via INTRAVENOUS

## 2021-06-13 MED ORDER — CIPROFLOXACIN IN D5W 400 MG/200ML IV SOLN
INTRAVENOUS | Status: AC
  Administered 2021-06-13: 400 mg via INTRAVENOUS
  Filled 2021-06-13: qty 200

## 2021-06-13 MED ORDER — IOHEXOL 300 MG/ML  SOLN: 30.0000 mL | Freq: Once | INTRAMUSCULAR | Status: DC | PRN

## 2021-06-13 MED ORDER — SODIUM CHLORIDE 0.9 % IV SOLN: INTRAVENOUS | Status: DC

## 2021-06-13 MED ORDER — CIPROFLOXACIN IN D5W 400 MG/200ML IV SOLN: 400.0000 mg | Freq: Once | INTRAVENOUS | Status: AC

## 2021-06-13 MED ORDER — IOHEXOL 300 MG/ML  SOLN
50.0000 mL | Freq: Once | INTRAMUSCULAR | Status: AC | PRN
  Administered 2021-06-13: 45 mL

## 2021-06-13 MED ORDER — MIDAZOLAM HCL 2 MG/2ML IJ SOLN
INTRAMUSCULAR | Status: AC
  Filled 2021-06-13: qty 2

## 2021-06-13 NOTE — H&P (Signed)
Referring Physician(s): Gay,Matthew R  Supervising Physician: Simonne Come  Patient Status:  [facility] OP  Chief Complaint: Left renal stone   Subjective: Patient familiar to IR service from left percutaneous nephrostomy on 04/28/2021 secondary to infected and obstructing staghorn calculus in the lower pole and interpolar collecting system of the left kidney with associated hydronephrosis.  Urine cultures grew rare Klebsiella pneumoniae and she has been treated with outpatient Cipro. Plans were for patient to eventually undergo nephrolithotomy as an outpatient. She was discharged from [facility] on 05/03/2021 with nephrostomy in place ,however returned to ED on 05/17/2021 with nephrostomy no longer in place.  Caregiver reports that patient pulled drain out secondary to irritation in the context of existing confusion/dementia.  She presents today for replacement of left nephrostomy prior to planned nephrolithotomy , tentatively planned for 07/26/21.  Caregiver reports no fever, headache, chest pain, dyspnea, cough, worsening abdominal/back pain, nausea, vomiting or bleeding.  She is hard of hearing.  Additional history as below.  History reviewed. No pertinent past medical history. Past Surgical History:  Procedure Laterality Date   IR NEPHROSTOMY PLACEMENT LEFT  04/28/2021     Allergies: Patient has no known allergies.  Medications: Prior to Admission medications   Medication Sig Start Date End Date Taking? Authorizing Provider  acetaminophen (TYLENOL) 325 MG tablet Take 650 mg by mouth every 6 (six) hours as needed for mild pain.    [provider]  atenolol (TENORMIN) 50 MG tablet Take 0.5 tablets (25 mg total) by mouth daily. 05/03/21   Zannie Cove, MD  ciprofloxacin (CIPRO) 500 MG tablet Take 1 tablet (500 mg total) by mouth 2 (two) times daily. 05/03/21   Zannie Cove, MD  donepezil (ARICEPT) 10 MG tablet Take 10 mg by mouth at bedtime.    [provider]   simvastatin (ZOCOR) 20 MG tablet Take 20 mg by mouth at bedtime.    [provider]  solifenacin (VESICARE) 5 MG tablet Take 5 mg by mouth at bedtime.    [provider]     Vital Signs: Vitals:   06/13/21 1044  BP: (!) 163/67  Pulse: 61  Resp: 18  Temp: 97.7 F (36.5 C)  SpO2: 100%    LMP  (LMP Unknown)   Physical Exam awake, oriented to person/birthdate but not location; chest clear to auscultation bilaterally.  Heart with regular rate and rhythm.  Abdomen soft, positive bowel sounds, nontender.  No lower extremity edema.  Imaging: No results found.  Labs:  CBC: Recent Labs    05/01/21 0538 05/01/21 1159 05/03/21 0606 05/17/21 1802  WBC 10.6* 11.2* 10.2 7.9  HGB 12.1 13.0 12.2 12.8  HCT 35.6* 38.1 36.0 40.4  PLT 136* 160 216 271    COAGS: Recent Labs    04/27/21 1313 04/28/21 0029 05/17/21 1802  INR 1.2 1.2 1.0  APTT 30  --   --     BMP: Recent Labs    05/01/21 0538 05/02/21 0508 05/03/21 0606 05/17/21 1802  NA 137 137 142 139  K 3.5 3.2* 3.7 4.5  CL 105 106 109 106  CO2 26 26 29 24   GLUCOSE 94 87 85 99  BUN 12 13 13  26*  CALCIUM 8.6* 8.5* 8.9 9.6  CREATININE 1.20* 1.12* 1.03* 1.62*  GFRNONAA 45* 48* 54* 31*    LIVER FUNCTION TESTS: Recent Labs    04/27/21 1242 04/28/21 0029 05/17/21 1802  BILITOT 1.7* 1.3* 0.4  AST 19 20 19   ALT 10  12 12  ALKPHOS 63 53 75  PROT 7.1 5.6* 7.5  ALBUMIN 3.4* 2.7* 3.9    Assessment and Plan: Patient familiar to IR service from left percutaneous nephrostomy on 04/28/2021 secondary to infected and obstructing staghorn calculus in the lower pole and interpolar collecting system of the left kidney with associated hydronephrosis.  Urine cultures grew rare Klebsiella pneumoniae and she has been treated with outpatient Cipro. Plans were for patient to eventually undergo nephrolithotomy as an outpatient. She was discharged from [facility] on 05/03/2021 with nephrostomy in place  ,however returned to ED on 05/17/2021 with nephrostomy no longer in place.  Caregiver reports that patient pulled drain out secondary to irritation in the context of existing confusion/dementia.  She presents today for replacement of left nephrostomy prior to planned nephrolithotomy , tentatively planned for 07/26/21. Risks and benefits of left PCN placement was discussed with the patient /guardian Eliot Ford including, but not limited to, infection, bleeding, significant bleeding causing loss or decrease in renal function or damage to adjacent structures.   All of the patient's questions were answered, patient/guardian are agreeable to proceed.  Consent signed and in chart.      Electronically Signed: D. Jeananne Rama, PA-C 06/13/2021, 10:37 AM   I spent a total of 25 minutes at the the patient's bedside AND on the patient's hospital floor or unit, greater than 50% of which was counseling/coordinating care for left percutaneous nephrostomy

## 2021-06-13 NOTE — Progress Notes (Signed)
Assisted pt. To bedside commode- urinated 150cc blood tinged urine.   Instructed caregiver on how to flush nephro tube to left side.  Watched and then caregiver flushed tube with RN at side. Provided caregiver with instructions on care of tube; flushing and change of dsg.  Caregiver has understanding of instructions. Urostomy bag was given just in case needed to attach to  nephrostomy tube. See discharge teaching notes.

## 2021-06-13 NOTE — Progress Notes (Signed)
Patient arrived to Short Stay for IR procedure.  Caregiver Shanara Riddick at bedside.  During IR assessment, patient states "I wish I would just die", "I want to go home to God", "I've lived a good life and I am ok with dying"  Caregiver states patient makes statements daily about wanting to die, however she has never stated she wanted to kill herself or has developed a plan to kill herself.  Jeananne Rama, PA with IR notified.  RN notified guardian Eliot Ford who states this is a new behavior within the last week or 2.  Dorian states they have notified patients PCP Annita Brod, and awaiting returned call.

## 2021-06-13 NOTE — Discharge Instructions (Addendum)
Please call Interventional Radiology clinic [phone] with any questions or concerns.   You may remove your dressing and shower tomorrow.   Flushing regimen: Normal Saline 10 mL twice a day for 4 days, then may flush as needed.   Percutaneous Nephrostomy, Care After This sheet gives you information about how to care for yourself after your procedure. Your health care provider may also give you more specific instructions. If you have problems or questions, contact your health careprovider. What can I expect after the procedure? After the procedure, it is common to have: Some soreness where the nephrostomy tube was inserted (tube insertion site). Blood-tinged drainage from the nephrostomy tube for the first 24 hours. Follow these instructions at home: Activity  Do not lift anything that is heavier than 10 lb (4.5 kg), or the limit that you are told, until your health care provider says that it is safe. Return to your normal activities as told by your health care provider. Ask your health care provider what activities are safe for you. Avoid activities that may cause the nephrostomy tubing to bend. Do not take baths, swim, or use a hot tub until your health care provider approves. Ask your health care provider if you can take showers. Cover the nephrostomy tube bandage (dressing) with a watertight covering when you take a shower. If you were given a sedative during the procedure, it can affect you for several hours. Do not drive or operate machinery until your health care provider says that it is safe.   Care of the tube insertion site  Follow instructions from your health care provider about how to take care of your tube insertion site. Make sure you: Wash your hands with soap and water for at least 20 seconds before you change your dressing. If soap and water are not available, use hand sanitizer. Change your dressing as told by your health care provider. Be careful not to pull on the  tube while removing the dressing. When you change the dressing, wash the skin around the tube, rinse well, and pat the skin dry. Check the tube insertion area every day for signs of infection. Check for: Redness, swelling, or pain. Fluid or blood. Warmth. Pus or a bad smell.   Flushing regimen: Normal Saline 10 mL twice a day for 4 days, then may flush as needed.  General instructions Take over-the-counter and prescription medicines only as told by your health care provider. Keep all follow-up visits as told by your health care provider. This is important. The nephrostomy tube will need to be changed every 8-12 weeks. Contact a health care provider if: You have problems with any of the valves or tubing. You have persistent pain or soreness in your back. You have redness, swelling, or pain around your tube insertion site. You have fluid or blood coming from your tube insertion site. Your tube insertion site feels warm to the touch. You have pus or a bad smell coming from your tube insertion site. You have increased urine output or you feel burning when urinating. Get help right away if: You have pain in your abdomen during the first week. You have chest pain or have trouble breathing. You have a new appearance of blood in your urine. You have a fever or chills. You have back pain that is not relieved by your medicine. You have decreased urine output. Your nephrostomy tube comes out. Summary After the procedure, it is common to have some soreness where the nephrostomy tube was inserted (  tube insertion site). Follow instructions from your health care provider about how to take care of your tube insertion site, nephrostomy tube, and drainage bag. Keep all follow-up visits for care and for changing the tube. This information is not intended to replace advice given to you by your health care provider. Make sure you discuss any questions you have with your healthcare provider. Document  Revised: 12/20/2019 Document Reviewed: 12/20/2019 Elsevier Patient Education  2022 [employer]   Moderate Conscious Sedation, Adult, Care After This sheet gives you information about how to care for yourself after your procedure. Your health care provider may also give you more specific instructions. If you have problems or questions, contact your health care provider. What can I expect after the procedure? After the procedure, it is common to have: Sleepiness for several hours. Impaired judgment for several hours. Difficulty with balance. Vomiting if you eat too soon. Follow these instructions at home: For the time period you were told by your health care provider: Rest. Do not participate in activities where you could fall or become injured. Do not drive or use machinery. Do not drink alcohol. Do not take sleeping pills or medicines that cause drowsiness. Do not make important decisions or sign legal documents. Do not take care of children on your own.      Eating and drinking Follow the diet recommended by your health care provider. Drink enough fluid to keep your urine pale yellow. If you vomit: Drink water, juice, or soup when you can drink without vomiting. Make sure you have little or no nausea before eating solid foods.   General instructions Take over-the-counter and prescription medicines only as told by your health care provider. Have a responsible adult stay with you for the time you are told. It is important to have someone help care for you until you are awake and alert. Do not smoke. Keep all follow-up visits as told by your health care provider. This is important. Contact a health care provider if: You are still sleepy or having trouble with balance after 24 hours. You feel light-headed. You keep feeling nauseous or you keep vomiting. You develop a rash. You have a fever. You have redness or swelling around the IV site. Get help right away if: You have  trouble breathing. You have new-onset confusion at home. Summary After the procedure, it is common to feel sleepy, have impaired judgment, or feel nauseous if you eat too soon. Rest after you get home. Know the things you should not do after the procedure. Follow the diet recommended by your health care provider and drink enough fluid to keep your urine pale yellow. Get help right away if you have trouble breathing or new-onset confusion at home. This information is not intended to replace advice given to you by your health care provider. Make sure you discuss any questions you have with your health care provider. Document Revised: 03/23/2020 Document Reviewed: 10/20/2019 Elsevier Patient Education  2021 [employer].

## 2021-06-13 NOTE — Procedures (Signed)
Pre Procedure Dx: Left sided staghorn calculus Post Procedural Dx: Same  Successful [place] and fluoroscopic guided placement of a left sided nephro-ureteral catheter with inferior end within the urinary bladder and superior end slightly uncoiled within the left renal pelvis.  The nephroureteral catheter was capped.  EBL: Trace Complications: None immediate.  Katherina Right, MD Pager #: [phone]

## 2021-07-09 NOTE — Patient Instructions (Addendum)
DUE TO COVID-19 ONLY ONE VISITOR IS ALLOWED TO COME WITH YOU AND STAY IN THE WAITING ROOM ONLY DURING PRE OP AND PROCEDURE DAY OF SURGERY. THE 1 VISITOR  MAY VISIT WITH YOU AFTER SURGERY IN YOUR PRIVATE ROOM DURING VISITING HOURS ONLY!  YOU NEED TO HAVE A COVID 19 TEST ON: 07/24/21 , THIS TEST MUST BE DONE BEFORE SURGERY,  COVID TESTING SITE: [address] [place],. Phone [phone]. No appointment needed. Open M - F  8 am to 3 pm.. It's a drive-up.               Annette Vaughn   Your procedure is scheduled on: 07/26/21   Report to [facility] Main  Entrance   Report to admitting at: 7:15 AM     Call this number if you have problems the morning of surgery [phone]    Remember: Do not eat solid food :After Midnight. Clear liquids until: 6:15 AM.  CLEAR LIQUID DIET  Foods Allowed                                                                     Foods Excluded  Coffee and tea, regular and decaf                             liquids that you cannot  Plain Jell-O any favor except red or purple                                           see through such as: Fruit ices (not with fruit pulp)                                     milk, soups, orange juice  Iced Popsicles                                    All solid food Carbonated beverages, regular and diet                                    Cranberry, grape and apple juices Sports drinks like Gatorade Lightly seasoned clear broth or consume(fat free) Sugar, honey syrup  Sample Menu Breakfast                                Lunch                                     Supper Cranberry juice                    Beef broth  Chicken broth Jell-O                                     Grape juice                           Apple juice Coffee or tea                        Jell-O                                      Popsicle                                                Coffee or tea                         Coffee or tea  _____________________________________________________________________  BRUSH YOUR TEETH MORNING OF SURGERY AND RINSE YOUR MOUTH OUT, NO CHEWING GUM CANDY OR MINTS.    Take these medicines the morning of surgery with A SIP OF WATER: atenelol.                            You may not have any metal on your body including hair pins and              piercings  Do not wear jewelry, make-up, lotions, powders or perfumes, deodorant             Do not wear nail polish on your fingernails.  Do not shave  48 hours prior to surgery.    Do not bring valuables to the hospital. [facility] IS NOT             RESPONSIBLE   FOR VALUABLES.  Contacts, dentures or bridgework may not be worn into surgery.  Leave suitcase in the car. After surgery it may be brought to your room.     Patients discharged the day of surgery will not be allowed to drive home. IF YOU ARE HAVING SURGERY AND GOING HOME THE SAME DAY, YOU MUST HAVE AN ADULT TO DRIVE YOU HOME AND BE WITH YOU FOR 24 HOURS. YOU MAY GO HOME BY TAXI OR UBER OR ORTHERWISE, BUT AN ADULT MUST ACCOMPANY YOU HOME AND STAY WITH YOU FOR 24 HOURS.  Name and phone number of your driver:  Special Instructions: N/A              Please read over the following fact sheets you were given: _____________________________________________________________________           [facility] - Preparing for Surgery Before surgery, you can play an important role.  Because skin is not sterile, your skin needs to be as free of germs as possible.  You can reduce the number of germs on your skin by washing with CHG (chlorahexidine gluconate) soap before surgery.  CHG is an antiseptic cleaner which kills germs and bonds with the skin to continue killing germs even after washing. Please DO NOT use if you have an allergy to CHG or antibacterial soaps.  If your skin becomes reddened/irritated stop using the CHG and inform your nurse when you arrive at Short Stay. Do not shave  (including legs and underarms) for at least 48 hours prior to the first CHG shower.  You may shave your face/neck. Please follow these instructions carefully:  1.  Shower with CHG Soap the night before surgery and the  morning of Surgery.  2.  If you choose to wash your hair, wash your hair first as usual with your  normal  shampoo.  3.  After you shampoo, rinse your hair and body thoroughly to remove the  shampoo.                           4.  Use CHG as you would any other liquid soap.  You can apply chg directly  to the skin and wash                       Gently with a scrungie or clean washcloth.  5.  Apply the CHG Soap to your body ONLY FROM THE NECK DOWN.   Do not use on face/ open                           Wound or open sores. Avoid contact with eyes, ears mouth and genitals (private parts).                       Wash face,  Genitals (private parts) with your normal soap.             6.  Wash thoroughly, paying special attention to the area where your surgery  will be performed.  7.  Thoroughly rinse your body with warm water from the neck down.  8.  DO NOT shower/wash with your normal soap after using and rinsing off  the CHG Soap.                9.  Pat yourself dry with a clean towel.            10.  Wear clean pajamas.            11.  Place clean sheets on your bed the night of your first shower and do not  sleep with pets. Day of Surgery : Do not apply any lotions/deodorants the morning of surgery.  Please wear clean clothes to the hospital/surgery center.  FAILURE TO FOLLOW THESE INSTRUCTIONS MAY RESULT IN THE CANCELLATION OF YOUR SURGERY PATIENT SIGNATURE_________________________________  NURSE SIGNATURE__________________________________  ________________________________________________________________________

## 2021-07-10 ENCOUNTER — Encounter (HOSPITAL_COMMUNITY): Payer: Self-pay

## 2021-07-10 ENCOUNTER — Encounter (HOSPITAL_COMMUNITY)
Admission: RE | Admit: 2021-07-10 | Discharge: 2021-07-10 | Disposition: A | Discharge status: Home / Self Care | Payer: Medicare Other | Source: Ambulatory Visit | Attending: Urology | Admitting: Urology

## 2021-07-10 ENCOUNTER — Other Ambulatory Visit: Payer: Self-pay

## 2021-07-10 DIAGNOSIS — Z79899 Other long term (current) drug therapy: Secondary | ICD-10-CM | POA: Insufficient documentation

## 2021-07-10 DIAGNOSIS — Z01812 Encounter for preprocedural laboratory examination: Secondary | ICD-10-CM | POA: Insufficient documentation

## 2021-07-10 LAB — BASIC METABOLIC PANEL
Anion gap: 12 (ref 5–15)
BUN: 22 mg/dL (ref 8–23)
CO2: 25 mmol/L (ref 22–32)
Calcium: 9.9 mg/dL (ref 8.9–10.3)
Chloride: 105 mmol/L (ref 98–111)
Creatinine, Ser: 1.31 mg/dL — ABNORMAL HIGH (ref 0.44–1.00)
GFR, Estimated: 40 mL/min — ABNORMAL LOW (ref >60)
Glucose, Bld: 110 mg/dL — ABNORMAL HIGH (ref 70–99)
Potassium: 3.8 mmol/L (ref 3.5–5.1)
Sodium: 142 mmol/L (ref 135–145)

## 2021-07-10 LAB — CBC
HCT: 43.2 % (ref 36.0–46.0)
Hemoglobin: 14.9 g/dL (ref 12.0–15.0)
MCH: 32.1 pg (ref 26.0–34.0)
MCHC: 34.5 g/dL (ref 30.0–36.0)
MCV: 93.1 fL (ref 80.0–100.0)
Platelets: 218 10*3/uL (ref 150–400)
RBC: 4.64 MIL/uL (ref 3.87–5.11)
RDW: 14.3 % (ref 11.5–15.5)
WBC: 8 10*3/uL (ref 4.0–10.5)
nRBC: 0 % (ref 0.0–0.2)

## 2021-07-10 NOTE — Progress Notes (Signed)
COVID Vaccine Completed: Yes Date COVID Vaccine completed: 03/2021 x 3 COVID vaccine manufacturer: Pfizer      PCP - Wilhelmina Mcardle Cardiologist - No  Chest x-ray - 04/27/21. EPIC EKG - 04/29/21. EPIC Stress Test -  ECHO -  Cardiac Cath -  Pacemaker/ICD device last checked:  Sleep Study -  CPAP -   Fasting Blood Sugar -  Checks Blood Sugar _____ times a day  Blood Thinner Instructions: Aspirin Instructions: Last Dose:  Anesthesia review:   Patient denies shortness of breath, fever, cough and chest pain at PAT appointment   Patient verbalized understanding of instructions that were given to them at the PAT appointment. Patient was also instructed that they will need to review over the PAT instructions again at home before surgery.

## 2021-07-24 ENCOUNTER — Other Ambulatory Visit: Payer: Self-pay | Admitting: Urology

## 2021-07-24 LAB — SARS CORONAVIRUS 2 (TAT 6-24 HRS): SARS Coronavirus 2: NEGATIVE

## 2021-07-25 MED ORDER — SODIUM CHLORIDE 0.9 % IV SOLN
2.0000 g | Freq: Once | INTRAVENOUS | Status: AC
  Administered 2021-07-26: 2 g via INTRAVENOUS
  Filled 2021-07-25: qty 2

## 2021-07-25 MED ORDER — GENTAMICIN SULFATE 40 MG/ML IJ SOLN
5.0000 mg/kg | INTRAVENOUS | Status: AC
  Administered 2021-07-26: 310 mg via INTRAVENOUS
  Filled 2021-07-25: qty 7.75

## 2021-07-25 NOTE — Anesthesia Preprocedure Evaluation (Addendum)
Anesthesia Evaluation  Patient identified by MRN, date of birth, ID band Patient awake    Reviewed: Allergy & Precautions, NPO status , Patient's Chart, lab work & pertinent test results, reviewed documented beta blocker date and time   History of Anesthesia Complications Negative for: history of anesthetic complications  Airway Mallampati: II  TM Distance: >3 FB Neck ROM: Full    Dental  (+) Missing,    Pulmonary neg pulmonary ROS,    Pulmonary exam normal        Cardiovascular hypertension, Pt. on medications and Pt. on home beta blockers Normal cardiovascular exam     Neuro/Psych Dementia    GI/Hepatic negative GI ROS, Neg liver ROS,   Endo/Other  negative endocrine ROS  Renal/GU Renal stones, Cr 1.3  negative genitourinary   Musculoskeletal negative musculoskeletal ROS (+)   Abdominal   Peds  Hematology negative hematology ROS (+)   Anesthesia Other Findings Day of surgery medications reviewed with patient.  Reproductive/Obstetrics negative OB ROS                            Anesthesia Physical Anesthesia Plan  ASA: 2  Anesthesia Plan: General   Post-op Pain Management:    Induction: Intravenous  PONV Risk Score and Plan: 3 and Treatment may vary due to age or medical condition, Dexamethasone and Ondansetron  Airway Management Planned: Oral ETT  Additional Equipment: None  Intra-op Plan:   Post-operative Plan: Extubation in OR  Informed Consent: I have reviewed the patients History and Physical, chart, labs and discussed the procedure including the risks, benefits and alternatives for the proposed anesthesia with the patient or authorized representative who has indicated his/her understanding and acceptance.     Dental advisory given and Consent reviewed with POA  Plan Discussed with: CRNA  Anesthesia Plan Comments: (Consent obtained from POA, Eliot Ford, via  telephone. Stephannie Peters, MD )      Anesthesia Quick Evaluation

## 2021-07-26 ENCOUNTER — Encounter (HOSPITAL_COMMUNITY): Payer: Self-pay | Admitting: Urology

## 2021-07-26 ENCOUNTER — Inpatient Hospital Stay (HOSPITAL_COMMUNITY): Payer: Medicare Other | Admitting: Anesthesiology

## 2021-07-26 ENCOUNTER — Observation Stay (HOSPITAL_COMMUNITY): Payer: Medicare Other

## 2021-07-26 ENCOUNTER — Inpatient Hospital Stay (HOSPITAL_COMMUNITY): Payer: Medicare Other

## 2021-07-26 ENCOUNTER — Encounter (HOSPITAL_COMMUNITY)
Admission: RE | Disposition: A | Discharge status: Skilled Nursing Facility | Payer: Self-pay | Source: Ambulatory Visit | Attending: Urology

## 2021-07-26 ENCOUNTER — Other Ambulatory Visit (HOSPITAL_COMMUNITY): Payer: Self-pay

## 2021-07-26 ENCOUNTER — Inpatient Hospital Stay (HOSPITAL_COMMUNITY)
Admission: RE | Admit: 2021-07-26 | Discharge: 2021-07-28 | DRG: 661 | Disposition: A | Discharge status: Skilled Nursing Facility | Payer: Medicare Other | Source: Ambulatory Visit | Attending: Urology | Admitting: Urology

## 2021-07-26 DIAGNOSIS — J939 Pneumothorax, unspecified: Secondary | ICD-10-CM

## 2021-07-26 DIAGNOSIS — Z20822 Contact with and (suspected) exposure to covid-19: Secondary | ICD-10-CM | POA: Diagnosis present

## 2021-07-26 DIAGNOSIS — F0390 Unspecified dementia without behavioral disturbance: Secondary | ICD-10-CM | POA: Diagnosis present

## 2021-07-26 DIAGNOSIS — I10 Essential (primary) hypertension: Secondary | ICD-10-CM | POA: Diagnosis present

## 2021-07-26 DIAGNOSIS — Z79899 Other long term (current) drug therapy: Secondary | ICD-10-CM | POA: Diagnosis not present

## 2021-07-26 DIAGNOSIS — N200 Calculus of kidney: Secondary | ICD-10-CM | POA: Diagnosis present

## 2021-07-26 HISTORY — DX: Chronic kidney disease, unspecified: N18.9

## 2021-07-26 HISTORY — PX: CYSTOSCOPY WITH STENT PLACEMENT: SHX5790

## 2021-07-26 HISTORY — PX: NEPHROLITHOTOMY: SHX5134

## 2021-07-26 HISTORY — PX: URETEROSCOPY WITH HOLMIUM LASER LITHOTRIPSY: SHX6645

## 2021-07-26 HISTORY — DX: Unspecified dementia, unspecified severity, without behavioral disturbance, psychotic disturbance, mood disturbance, and anxiety: F03.90

## 2021-07-26 LAB — CBC
HCT: 42.1 % (ref 36.0–46.0)
Hemoglobin: 14.1 g/dL (ref 12.0–15.0)
MCH: 31.3 pg (ref 26.0–34.0)
MCHC: 33.5 g/dL (ref 30.0–36.0)
MCV: 93.3 fL (ref 80.0–100.0)
Platelets: 152 10*3/uL (ref 150–400)
RBC: 4.51 MIL/uL (ref 3.87–5.11)
RDW: 13.8 % (ref 11.5–15.5)
WBC: 4.9 10*3/uL (ref 4.0–10.5)
nRBC: 0 % (ref 0.0–0.2)

## 2021-07-26 LAB — BASIC METABOLIC PANEL
Anion gap: 8 (ref 5–15)
BUN: 16 mg/dL (ref 8–23)
CO2: 24 mmol/L (ref 22–32)
Calcium: 9.5 mg/dL (ref 8.9–10.3)
Chloride: 106 mmol/L (ref 98–111)
Creatinine, Ser: 1.59 mg/dL — ABNORMAL HIGH (ref 0.44–1.00)
GFR, Estimated: 32 mL/min — ABNORMAL LOW (ref >60)
Glucose, Bld: 111 mg/dL — ABNORMAL HIGH (ref 70–99)
Potassium: 4.3 mmol/L (ref 3.5–5.1)
Sodium: 138 mmol/L (ref 135–145)

## 2021-07-26 SURGERY — NEPHROLITHOTOMY PERCUTANEOUS
Anesthesia: General | Laterality: Left

## 2021-07-26 MED ORDER — ORAL CARE MOUTH RINSE: 15.0000 mL | Freq: Once | OROMUCOSAL | Status: DC

## 2021-07-26 MED ORDER — OXYCODONE-ACETAMINOPHEN 5-325 MG PO TABS
1.0000 | ORAL_TABLET | ORAL | 0 refills | Status: AC | PRN
  Filled 2021-07-26: qty 18, 3d supply, fill #0

## 2021-07-26 MED ORDER — OXYBUTYNIN CHLORIDE ER 10 MG PO TB24
10.0000 mg | ORAL_TABLET | Freq: Every day | ORAL | Status: DC
  Administered 2021-07-26 – 2021-07-28 (×3): 10 mg via ORAL
  Filled 2021-07-26 (×3): qty 1

## 2021-07-26 MED ORDER — ONDANSETRON HCL 4 MG/2ML IJ SOLN
INTRAMUSCULAR | Status: DC | PRN
  Administered 2021-07-26: 4 mg via INTRAVENOUS

## 2021-07-26 MED ORDER — DEXMEDETOMIDINE (PRECEDEX) IN NS 20 MCG/5ML (4 MCG/ML) IV SYRINGE
PREFILLED_SYRINGE | INTRAVENOUS | Status: DC | PRN
  Administered 2021-07-26 (×2): 4 ug via INTRAVENOUS

## 2021-07-26 MED ORDER — ENSURE ENLIVE PO LIQD
237.0000 mL | Freq: Two times a day (BID) | ORAL | Status: DC
  Administered 2021-07-27 – 2021-07-28 (×4): 237 mL via ORAL

## 2021-07-26 MED ORDER — FENTANYL CITRATE (PF) 100 MCG/2ML IJ SOLN
INTRAMUSCULAR | Status: AC
  Filled 2021-07-26: qty 2

## 2021-07-26 MED ORDER — LIDOCAINE 2% (20 MG/ML) 5 ML SYRINGE
INTRAMUSCULAR | Status: DC | PRN
  Administered 2021-07-26: 60 mg via INTRAVENOUS

## 2021-07-26 MED ORDER — DOCUSATE SODIUM 100 MG PO CAPS
100.0000 mg | ORAL_CAPSULE | Freq: Two times a day (BID) | ORAL | Status: DC
  Administered 2021-07-26 – 2021-07-28 (×4): 100 mg via ORAL
  Filled 2021-07-26 (×4): qty 1

## 2021-07-26 MED ORDER — ROCURONIUM BROMIDE 10 MG/ML (PF) SYRINGE
PREFILLED_SYRINGE | INTRAVENOUS | Status: AC
  Filled 2021-07-26: qty 10

## 2021-07-26 MED ORDER — ROCURONIUM BROMIDE 10 MG/ML (PF) SYRINGE
PREFILLED_SYRINGE | INTRAVENOUS | Status: DC | PRN
  Administered 2021-07-26: 50 mg via INTRAVENOUS

## 2021-07-26 MED ORDER — BELLADONNA ALKALOIDS-OPIUM 16.2-60 MG RE SUPP: 1.0000 | Freq: Four times a day (QID) | RECTAL | Status: DC | PRN

## 2021-07-26 MED ORDER — DEXAMETHASONE SODIUM PHOSPHATE 10 MG/ML IJ SOLN
INTRAMUSCULAR | Status: DC | PRN
  Administered 2021-07-26: 8 mg via INTRAVENOUS

## 2021-07-26 MED ORDER — ACETAMINOPHEN 325 MG PO TABS
650.0000 mg | ORAL_TABLET | ORAL | Status: DC | PRN
  Administered 2021-07-27 (×2): 650 mg via ORAL
  Filled 2021-07-26 (×2): qty 2

## 2021-07-26 MED ORDER — SODIUM CHLORIDE 0.9 % IV SOLN
1.0000 g | INTRAVENOUS | Status: DC
  Administered 2021-07-26 – 2021-07-27 (×2): 1 g via INTRAVENOUS
  Filled 2021-07-26 (×2): qty 10
  Filled 2021-07-26: qty 1

## 2021-07-26 MED ORDER — PROPOFOL 10 MG/ML IV BOLUS
INTRAVENOUS | Status: DC | PRN
  Administered 2021-07-26: 90 mg via INTRAVENOUS

## 2021-07-26 MED ORDER — ONDANSETRON HCL 4 MG/2ML IJ SOLN: 4.0000 mg | INTRAMUSCULAR | Status: DC | PRN

## 2021-07-26 MED ORDER — FENTANYL CITRATE (PF) 100 MCG/2ML IJ SOLN
INTRAMUSCULAR | Status: AC
  Filled 2021-07-26: qty 4

## 2021-07-26 MED ORDER — CHLORHEXIDINE GLUCONATE CLOTH 2 % EX PADS
6.0000 | MEDICATED_PAD | Freq: Every day | CUTANEOUS | Status: DC
  Administered 2021-07-27: 6 via TOPICAL

## 2021-07-26 MED ORDER — LIDOCAINE 2% (20 MG/ML) 5 ML SYRINGE
INTRAMUSCULAR | Status: AC
  Filled 2021-07-26: qty 5

## 2021-07-26 MED ORDER — HYDROMORPHONE HCL 1 MG/ML IJ SOLN
0.5000 mg | INTRAMUSCULAR | Status: DC | PRN
  Administered 2021-07-26: 1 mg via INTRAVENOUS
  Filled 2021-07-26: qty 1

## 2021-07-26 MED ORDER — OXYCODONE-ACETAMINOPHEN 5-325 MG PO TABS
ORAL_TABLET | ORAL | Status: AC
  Filled 2021-07-26: qty 1

## 2021-07-26 MED ORDER — IOHEXOL 300 MG/ML  SOLN
INTRAMUSCULAR | Status: DC | PRN
  Administered 2021-07-26: 24 mL

## 2021-07-26 MED ORDER — SODIUM CHLORIDE 0.9 % IR SOLN
Status: DC | PRN
  Administered 2021-07-26 (×7): 3000 mL
  Administered 2021-07-26: 6000 mL

## 2021-07-26 MED ORDER — 0.9 % SODIUM CHLORIDE (POUR BTL) OPTIME
TOPICAL | Status: DC | PRN
  Administered 2021-07-26: 1000 mL

## 2021-07-26 MED ORDER — LACTATED RINGERS IV SOLN
INTRAVENOUS | Status: DC
  Administered 2021-07-26: 1000 mL via INTRAVENOUS

## 2021-07-26 MED ORDER — CHLORHEXIDINE GLUCONATE 0.12 % MT SOLN: 15.0000 mL | Freq: Once | OROMUCOSAL | Status: DC

## 2021-07-26 MED ORDER — SODIUM CHLORIDE 0.9 % IV SOLN: 250.0000 mL | INTRAVENOUS | Status: DC | PRN

## 2021-07-26 MED ORDER — DEXAMETHASONE SODIUM PHOSPHATE 10 MG/ML IJ SOLN
INTRAMUSCULAR | Status: AC
  Filled 2021-07-26: qty 1

## 2021-07-26 MED ORDER — POTASSIUM CHLORIDE IN NACL 20-0.45 MEQ/L-% IV SOLN
INTRAVENOUS | Status: DC
  Administered 2021-07-26: 1000 mL via INTRAVENOUS
  Filled 2021-07-26 (×2): qty 1000

## 2021-07-26 MED ORDER — FENTANYL CITRATE (PF) 100 MCG/2ML IJ SOLN
INTRAMUSCULAR | Status: DC | PRN
  Administered 2021-07-26: 50 ug via INTRAVENOUS
  Administered 2021-07-26 (×3): 25 ug via INTRAVENOUS

## 2021-07-26 MED ORDER — DONEPEZIL HCL 10 MG PO TABS
10.0000 mg | ORAL_TABLET | Freq: Every day | ORAL | Status: DC
  Administered 2021-07-26 – 2021-07-27 (×2): 10 mg via ORAL
  Filled 2021-07-26 (×2): qty 1

## 2021-07-26 MED ORDER — SUGAMMADEX SODIUM 200 MG/2ML IV SOLN
INTRAVENOUS | Status: DC | PRN
  Administered 2021-07-26: 130 mg via INTRAVENOUS

## 2021-07-26 MED ORDER — FENTANYL CITRATE (PF) 100 MCG/2ML IJ SOLN
25.0000 ug | INTRAMUSCULAR | Status: DC | PRN
  Administered 2021-07-26: 50 ug via INTRAVENOUS

## 2021-07-26 MED ORDER — ONDANSETRON HCL 4 MG/2ML IJ SOLN
INTRAMUSCULAR | Status: AC
  Filled 2021-07-26: qty 2

## 2021-07-26 MED ORDER — POLYETHYLENE GLYCOL 3350 17 G PO PACK: 17.0000 g | PACK | Freq: Every day | ORAL | Status: DC | PRN

## 2021-07-26 MED ORDER — OXYCODONE-ACETAMINOPHEN 5-325 MG PO TABS
1.0000 | ORAL_TABLET | ORAL | Status: DC | PRN
  Administered 2021-07-26: 1 via ORAL

## 2021-07-26 MED ORDER — SODIUM CHLORIDE 0.9% FLUSH: 3.0000 mL | INTRAVENOUS | Status: DC | PRN

## 2021-07-26 MED ORDER — DOCUSATE SODIUM 100 MG PO CAPS
100.0000 mg | ORAL_CAPSULE | Freq: Every day | ORAL | 0 refills | Status: AC | PRN
  Filled 2021-07-26: qty 30, 30d supply, fill #0

## 2021-07-26 MED ORDER — SODIUM CHLORIDE 0.9% FLUSH
3.0000 mL | Freq: Two times a day (BID) | INTRAVENOUS | Status: DC
  Administered 2021-07-26 – 2021-07-28 (×4): 3 mL via INTRAVENOUS

## 2021-07-26 MED ORDER — CEPHALEXIN 500 MG PO CAPS
500.0000 mg | ORAL_CAPSULE | Freq: Two times a day (BID) | ORAL | 0 refills | Status: AC
  Filled 2021-07-26: qty 6, 3d supply, fill #0

## 2021-07-26 SURGICAL SUPPLY — 70 items
BAG COUNTER SPONGE SURGICOUNT (BAG) IMPLANT
BAG URINE DRAIN 2000ML AR STRL (UROLOGICAL SUPPLIES) ×2 IMPLANT
BAG URO CATCHER STRL LF (MISCELLANEOUS) ×2 IMPLANT
BASKET LASER NITINOL 1.9FR (BASKET) IMPLANT
BASKET ZERO TIP NITINOL 2.4FR (BASKET) IMPLANT
BENZOIN TINCTURE PRP APPL 2/3 (GAUZE/BANDAGES/DRESSINGS) ×2 IMPLANT
BLADE SURG 15 STRL LF DISP TIS (BLADE) ×1 IMPLANT
BLADE SURG 15 STRL SS (BLADE) ×1
CATH FOLEY 2W COUNCIL 20FR 5CC (CATHETERS) ×2 IMPLANT
CATH FOLEY 2WAY SLVR  5CC 16FR (CATHETERS) ×1
CATH FOLEY 2WAY SLVR 5CC 16FR (CATHETERS) ×1 IMPLANT
CATH INTERMIT  6FR 70CM (CATHETERS) IMPLANT
CATH MULTI PURPOSE 16FR DRAIN (CATHETERS) IMPLANT
CATH ROBINSON RED A/P 20FR (CATHETERS) IMPLANT
CATH ROBINSON RED A/P 8FR (CATHETERS) ×2 IMPLANT
CATH ULTRATHANE 14FR (CATHETERS) IMPLANT
CATH URET 5FR 28IN OPEN ENDED (CATHETERS) ×2 IMPLANT
CATH URET DUAL LUMEN 6-10FR 50 (CATHETERS) ×2 IMPLANT
CATH X-FORCE N30 NEPHROSTOMY (TUBING) IMPLANT
CHLORAPREP W/TINT 26 (MISCELLANEOUS) ×4 IMPLANT
CLOTH BEACON ORANGE TIMEOUT ST (SAFETY) ×2 IMPLANT
DRAPE C-ARM 42X120 X-RAY (DRAPES) ×2 IMPLANT
DRAPE LINGEMAN PERC (DRAPES) ×2 IMPLANT
DRAPE SHEET LG 3/4 BI-LAMINATE (DRAPES) IMPLANT
DRAPE SURG IRRIG POUCH 19X23 (DRAPES) ×2 IMPLANT
DRSG PAD ABDOMINAL 8X10 ST (GAUZE/BANDAGES/DRESSINGS) ×2 IMPLANT
DRSG TEGADERM 4X4.75 (GAUZE/BANDAGES/DRESSINGS) IMPLANT
DRSG TEGADERM 8X12 (GAUZE/BANDAGES/DRESSINGS) ×2 IMPLANT
EXTRACTOR STONE PERC NCIRCLE (MISCELLANEOUS) ×2 IMPLANT
GAUZE SPONGE 4X4 12PLY STRL (GAUZE/BANDAGES/DRESSINGS) ×2 IMPLANT
GLOVE SURG ENC TEXT LTX SZ7 (GLOVE) ×2 IMPLANT
GLOVE SURG ENC TEXT LTX SZ7.5 (GLOVE) ×2 IMPLANT
GOWN STRL REUS W/TWL LRG LVL3 (GOWN DISPOSABLE) ×2 IMPLANT
GUIDEWIRE AMPLAZ .035X145 (WIRE) IMPLANT
GUIDEWIRE STR DUAL SENSOR (WIRE) ×4 IMPLANT
GUIDEWIRE SUPER STIFF (WIRE) ×2 IMPLANT
GUIDEWIRE ZIPWRE .038 STRAIGHT (WIRE) ×2 IMPLANT
IV SET EXTENSION CATH 6 NF (IV SETS) IMPLANT
KIT BASIN OR (CUSTOM PROCEDURE TRAY) ×2 IMPLANT
KIT PROBE 340X3.4XDISP GRN (MISCELLANEOUS) IMPLANT
KIT PROBE TRILOGY 3.4X340 (MISCELLANEOUS)
KIT PROBE TRILOGY 3.9X350 (MISCELLANEOUS) ×4 IMPLANT
KIT TURNOVER KIT A (KITS) ×2 IMPLANT
LASER FIB FLEXIVA PULSE ID 365 (Laser) IMPLANT
MANIFOLD NEPTUNE II (INSTRUMENTS) ×2 IMPLANT
NEEDLE TROCAR 18X15 ECHO (NEEDLE) IMPLANT
NEEDLE TROCAR 18X20 (NEEDLE) IMPLANT
NS IRRIG 1000ML POUR BTL (IV SOLUTION) ×2 IMPLANT
PACK CYSTO (CUSTOM PROCEDURE TRAY) ×2 IMPLANT
SHEATH DILATOR SET 8/10 (MISCELLANEOUS) ×2 IMPLANT
SHEATH PEELAWAY SET 9 (SHEATH) IMPLANT
SHEATH URETERAL 12FRX35CM (MISCELLANEOUS) ×2 IMPLANT
SPONGE T-LAP 4X18 ~~LOC~~+RFID (SPONGE) ×2 IMPLANT
STENT CONTOUR 7FRX26X.038 (STENTS) ×2 IMPLANT
SURGIFLO W/THROMBIN 8M KIT (HEMOSTASIS) IMPLANT
SUT SILK 0 FSL (SUTURE) ×2 IMPLANT
SUT VIC AB 4-0 PS2 18 (SUTURE) IMPLANT
SYR 10ML LL (SYRINGE) ×2 IMPLANT
SYR 20ML LL LF (SYRINGE) ×4 IMPLANT
SYR 50ML LL SCALE MARK (SYRINGE) ×2 IMPLANT
TOWEL OR 17X26 10 PK STRL BLUE (TOWEL DISPOSABLE) ×2 IMPLANT
TRACTIP FLEXIVA PULS ID 200XHI (Laser) IMPLANT
TRACTIP FLEXIVA PULSE ID 200 (Laser)
TRAY FOLEY MTR SLVR 16FR STAT (SET/KITS/TRAYS/PACK) ×2 IMPLANT
TUBE CONNECTING VINYL 14FR 30C (TUBING) IMPLANT
TUBING CONNECTING 10 (TUBING) ×4 IMPLANT
TUBING STONE CATCHER TRILOGY (MISCELLANEOUS) ×4 IMPLANT
TUBING UROLOGY SET (TUBING) IMPLANT
WATER STERILE IRR 1000ML POUR (IV SOLUTION) ×2 IMPLANT
WATER STERILE IRR 3000ML UROMA (IV SOLUTION) ×2 IMPLANT

## 2021-07-26 NOTE — Transfer of Care (Signed)
Immediate Anesthesia Transfer of Care Note  Patient: Annette Vaughn  Procedure(s) Performed: NEPHROLITHOTOMY PERCUTANEOUS (Left) URETEROSCOPY WITH HOLMIUM LASER LITHOTRIPSY (Left) CYSTOSCOPY WITH STENT PLACEMENT (Left)  Patient Location: PACU  Anesthesia Type:General  Level of Consciousness: awake, alert  and patient cooperative  Airway & Oxygen Therapy: Patient Spontanous Breathing and Patient connected to face mask oxygen  Post-op Assessment: Report given to RN, Post -op Vital signs reviewed and stable and Patient moving all extremities  Post vital signs: Reviewed and stable  Last Vitals:  Vitals Value Taken Time  BP 109/92 07/26/21 1200  Temp    Pulse 63 07/26/21 1203  Resp 12 07/26/21 1203  SpO2 100 % 07/26/21 1203  Vitals shown include unvalidated device data.  Last Pain:  Vitals:   07/26/21 0743  TempSrc: Oral  PainSc: 0-No pain         Complications: No notable events documented.

## 2021-07-26 NOTE — H&P (Signed)
CC/HPI: Annette Vaughn is a very pleasant 86 yearKathie Vaughn old patient with a history of dementia who is seen in consultation for a left staghorn renal stone.   Of note, she has a history of dementia and lives in an independent living arrangement at a retirement facility however receives 24/7 caregiver support. She is seen today with Barnett AbuAnne Johns (who is on staff with [employer] Anne's cell is [phone]. The phone number for [employer] is [phone].   She presented to the ED on 04/28/2021 and found to have sepsis due to urinary source. CT A/P 04/27/2021 revealed a large staghorn stone in the left renal pelvis. She underwent left nephrostomy tube via a posterior lower pole calyx on 04/28/2021. She was then discharged home after recovery from her urinary tract infection. She then presented to the ED on 05/17/2021 brought in by her caregiver after she was found to not have a nephrostomy tube in place. Patient is unaware of when this nephrostomy tube was removed.   Patient denies abdominal pain, flank pain, fevers or chills. She has no increased frequency or urgency. She has noted some increased fatigue over the past couple of weeks.   Patient currently denies fever, chills, sweats, nausea, vomiting, abdominal or flank pain, gross hematuria or dysuria.     ALLERGIES: None   MEDICATIONS: None   GU PSH: None   NON-GU PSH: None   GU PMH: None   NON-GU PMH: None   FAMILY HISTORY: None   SOCIAL HISTORY: Marital Status: Single    REVIEW OF SYSTEMS:    GU Review Female:   Patient denies frequent urination, hard to postpone urination, burning /pain with urination, get up at night to urinate, leakage of urine, stream starts and stops, trouble starting your stream, have to strain to urinate, and being pregnant.  Gastrointestinal (Upper):   Patient denies nausea, vomiting, and indigestion/ heartburn.  Gastrointestinal (Lower):   Patient denies diarrhea and  constipation.  Constitutional:   Patient denies fever, night sweats, weight loss, and fatigue.  Skin:   Patient denies skin rash/ lesion and itching.  Eyes:   Patient denies blurred vision and double vision.  Ears/ Nose/ Throat:   Patient denies sore throat and sinus problems.  Hematologic/Lymphatic:   Patient denies swollen glands and easy bruising.  Cardiovascular:   Patient denies leg swelling and chest pains.  Respiratory:   Patient denies cough and shortness of breath.  Endocrine:   Patient denies excessive thirst.  Musculoskeletal:   Patient denies back pain and joint pain.  Neurological:   Patient denies headaches and dizziness.  Psychologic:   Patient denies depression and anxiety.   VITAL SIGNS:      05/23/2021 02:07 PM  Height 64 in / 162.56 cm  BP 132/75 mmHg  Pulse 58 /min  Temperature 98.4 F / 36.8 C   MULTI-SYSTEM PHYSICAL EXAMINATION:    Constitutional: Well-nourished. No physical deformities. Normally developed. Good grooming.  Respiratory: No labored breathing, no use of accessory muscles.   Cardiovascular: Normal temperature, normal extremity pulses, no swelling, no varicosities.  Gastrointestinal: No mass, no tenderness, no rigidity, non obese abdomen. No CVA tenderness. Well healed prior L PCN site.     Complexity of Data:  Source Of History:  Patient, Medical Record Summary  Records Review:   Previous Doctor Records, Previous Hospital Records  Urine Test Review:   Urinalysis  X-Ray Review: C.T. Abdomen/Pelvis: Reviewed Films. Reviewed Report. Discussed With Patient.    Notes:  CLINICAL DATA: Lower abdominal pain. History of left renal staghorn  calculus complicated by urosepsis.   EXAM:  CT ABDOMEN AND PELVIS WITHOUT CONTRAST   TECHNIQUE:  Multidetector CT imaging of the abdomen and pelvis was performed  following the standard protocol without IV contrast.   COMPARISON: CT abdomen pelvis dated Apr 27, 2021.   FINDINGS:  Lower  chest: No acute abnormality.   Hepatobiliary: The liver is unremarkable. Unchanged 2.6 cm  gallstone. No gallbladder wall thickening or biliary dilatation.   Pancreas: Unremarkable. No pancreatic ductal dilatation or  surrounding inflammatory changes.   Spleen: Normal in size without focal abnormality.   Adrenals/Urinary Tract: Adrenal glands are unremarkable. Unchanged  right renal cortical scarring. Unchanged staghorn calculus at the  left UPJ extending into the lower pole calyces. Decreased now  minimal left upper pole hydronephrosis. Improved left perinephric  fat stranding. Unchanged 1.6 cm simple cyst in the upper pole. The  bladder is unremarkable.   Stomach/Bowel: Stomach is within normal limits. Diminutive or absent  appendix. No evidence of bowel wall thickening, distention, or  inflammatory changes.   Vascular/Lymphatic: Aortic atherosclerosis. No enlarged abdominal or  pelvic lymph nodes.   Reproductive: Status post hysterectomy. No adnexal masses.   Other: No abdominal wall hernia or abnormality. No abdominopelvic  ascites. No pneumoperitoneum.   Musculoskeletal: No acute or significant osseous findings.   IMPRESSION:  1. Unchanged staghorn calculus at the left UPJ. Decreased now  minimal left upper pole hydronephrosis with improved left  perinephric fat stranding.  2. Unchanged cholelithiasis.  3. Aortic Atherosclerosis (ICD10-I70.0).    Electronically Signed  By: Obie Dredge M.D.  On: 05/17/2021 19:37   PROCEDURES: None   ASSESSMENT:      ICD-10 Details  1 GU:   Renal calculus - N20.0    PLAN:           Orders X-Rays: Interventional Radiology Without I.V. Contrast. No Oral Contrast - Left nephrostomy tube placement - ideally left lower pole. For planned access for L PCNL. On imaging, has retrorenal colon - nearby.          Schedule X-Rays: 2 Weeks - C.T. Abdomen/Pelvis Without I.V. Contrast. No Oral Contrast - To be done after nephrostomy  tube is in place          Document Letter(s):  Created for Patient: Clinical Summary         Notes:   #1: Left staghorn renal stone: Seen on CT A/P 04/28/2021. S/p left PCN on 04/28/2021. Unfortunately, her left percutaneous nephrostomy tube was likely removed by the patient at an unknown time sometime prior to 05/17/2021. We discussed options for treating her staghorn stone and in particular left percutaneous nephrolithotomy. We discussed that she would need her left percutaneous nephrostomy placed prior to this procedure. I will order this through interventional radiology and have them coordinate with [employer]. Discussed risk and benefits of this procedure as below.   I did review her CT and there is some colon nearby her left kidney. Is not exactly retrorenal however close by. I will obtain CT scan after nephrostomy tube is placed to ensure that there is no risk of bowel injury with a PCNL.   We discussed the options for management of kidney stones, including observation, ESWL, ureteroscopy with laser lithotripsy, and PCNL. The risks and benefits of each option were discussed.  For observation I described the risks which include but are not limited to silent renal damage, life-threatening infection, need for  emergent surgery, failure to pass stone, and pain.   ESWL: risks and benefits of ESWL were outlined including infection, bleeding, pain, steinstrasse, kidney injury, need for ancillary treatments, and global anesthesia risks including but not limited to CVA, MI, DVT, PE, pneumonia, and death.   Ureteroscopy: risks and benefits of ureteroscopy were outlined, including infection, bleeding, pain, temporary ureteral stent and associated stent bother, ureteral injury, ureteral stricture, need for ancillary treatments, and global anesthesia risks including but not limited to CVA, MI, DVT, PE, pneumonia, and death.   PCNL: risks and benefits of PCNL were outlined including  infection, bleeding, blood transfusion, pain, pneumothorax, bowel injury, persistent urine leak, positioning injury, inability to clear stone burden, renal laceration, arterial venous fistula or malformation, need for ancillary treatments, and global anesthesia risks including but not limited to CVA, MI, DVT, PE, pneumonia, and death.   CC: Annita Brod, MD   Urology Preoperative H&P   Chief Complaint: Left staghorn stone  History of Present Illness: WALAA CAREL is a 86 y.o. female with a left staghorn stone here for L PCNL. Denies fevers, chills, dysuria. She had a left nephroureteral stent placed on 06/13/2021. Ucx 6/10 resulted insignificant growth. Ucx 07/08/2021 resulted genial flora. She received a course of bactrim preoperatively for prophylaxis the week of surgery. She is to receive gent and ampicillin preoperatively.   Past Medical History:  Diagnosis Date   History of kidney stones    Hypertension     Past Surgical History:  Procedure Laterality Date   IR  NEPHROURETERAL CATH PLACE LEFT  06/13/2021   IR NEPHROSTOMY PLACEMENT LEFT  04/28/2021    Allergies: No Known Allergies  No family history on file.  Social History:  reports that she has never smoked. She has never used smokeless tobacco. She reports that she does not drink alcohol and does not use drugs.  ROS: A complete review of systems was performed.  All systems are negative except for pertinent findings as noted.  Physical Exam:  Vital signs in last 24 hours:   Constitutional:   No acute distress Cardiovascular: Regular rate and rhythm Respiratory: Normal respiratory effort, Lungs clear bilaterally GI: Abdomen is soft, nontender, nondistended, no abdominal masses GU: No CVA tenderness; L PCN tube in place Lymphatic: No lymphadenopathy  Laboratory Data:  No results for input(s): WBC, HGB, HCT, PLT in the last 72 hours.  No results for input(s): NA, K, CL, GLUCOSE, BUN, CALCIUM, CREATININE in the last 72  hours.  Invalid input(s): CO3   No results found for this or any previous visit (from the past 24 hour(s)). Recent Results (from the past 240 hour(s))  SARS Coronavirus 2 (TAT 6-24 hrs)     Status: None   Collection Time: 07/24/21 12:00 AM  Result Value Ref Range Status   SARS Coronavirus 2 RESULT: NEGATIVE  Final    Comment: RESULT: NEGATIVESARS-CoV-2 INTERPRETATION:A NEGATIVE  test result means that SARS-CoV-2 RNA was not present in the specimen above the limit of detection of this test. This does not preclude a possible SARS-CoV-2 infection and should not be used as the  sole basis for patient management decisions. Negative results must be combined with clinical observations, patient history, and epidemiological information. Optimum specimen types and timing for peak viral levels during infections caused by SARS-CoV-2  have not been determined. Collection of multiple specimens or types of specimens may be necessary to detect virus. Improper specimen collection and handling, sequence variability under primers/probes, or organism present below the limit  of detection may  lead to false negative results. Positive and negative predictive values of testing are highly dependent on prevalence. False negative test results are more likely when prevalence of disease is high.The expected result is NEGATIVE.Fact S heet for  Healthcare Providers: [employer] Sheet for Patients: https://poole-freeman.org/ Reference Range - Negative     Renal Function: No results for input(s): CREATININE in the last 168 hours. CrCl cannot be calculated (Unknown ideal weight.).  Radiologic Imaging: No results found.  I independently reviewed the above imaging studies.  Assessment and Plan MARYUM BATTERSON is a 86 y.o. female with a left staghorn stone here for L PCNL.  PCNL: risks and benefits of PCNL were outlined including infection, bleeding, blood  transfusion, pain, pneumothorax, bowel injury, persistent urine leak, positioning injury, inability to clear stone burden, renal laceration, arterial venous fistula or malformation, need for ancillary treatments, and global anesthesia risks including but not limited to CVA, MI, DVT, PE, pneumonia, and death.   Matt R. Ronda Rajkumar MD 08-04-21, 7:25 AM  [facility] Pager: [phone]

## 2021-07-26 NOTE — Op Note (Signed)
Operative Note  Preoperative diagnosis:  1.  Left renal staghorn stone  Postoperative diagnosis: 1.  Left renal staghorn stone  Procedure(s): 1.  Left percutaneous nephrolithotomy (greater than 2 cm) 2. Dilation of left renal percutaneous tract under fluoroscopy 3. Left antegrade ureteroscopy with basket stone extraction 4. Left antegrade nephrostogram 5. Fluoroscopy time less than 1 hour with interpretation 6. Left ureteral stent placement 7. Left percutaneous nephrostomy tube placement 8. Cystoscopy  Surgeon: Jettie Pagan, MD  Assistants:  None  Anesthesia:  General  Complications:  None  EBL:  95ml  Specimens: 1.  Renal stones for stone analysis to be done at [facility] urology specialist  Drains/Catheters: 1.   A 16-French Foley. 2.   Left 20-French Councill tip nephrostomy tube  3.  Left 7 French by 26 cm ureteral stent  Intraoperative findings:   Previously obtained interventional radiology access was in the left posterior interpolar calyx. Scout imaging confirmed a large staghorn stone measuring approximately 2.6 x 2.6 x 1.6 cm. Left antegrade ureteroscopy confirmed presence of access into the posterior left interpolar calyx.  The balloon and then access sheath were then placed under direct visualization into this calyx.  There was minimal bleeding. Successful fragmentation and removal of her staghorn stone.  No fragments remained at the end the case which was confirmed on fluoroscopy and pyeloscopy using a nephroscope, flexible cystoscope and flexible ureteroscope.  No evidence of any fragments in the ureter at the end of the case. Successful placement of a 7 [place] by 26 cm stent with a curl within the renal pelvis and bladder respectively. Placement of a 20 French nephrostomy tube within the renal pelvis.  Indication:  Annette Vaughn is a 86 y.o. female with a history of urolithiasis. Their stone burden included a 2.6 x 2.6 x 1.6 cm left staghorn stone.  She  presented to the ED in May 2022 and found to have a sepsis from urinary source.  CT A/P 04/27/2021 initially revealed the stone.  She underwent nephrostomy tube placement.  She then subsequently removed this nephrostomy tube and eventually had this replaced by interventional radiology on 06/13/2021.  Her preoperative urine culture on 07/08/2021 only resulted with genital flora with no risk of any infection.  She was covered over the past week with Bactrim for prophylaxis.  Of note, she does have a history of dementia and consent was obtained through her power of attorney, Barnett Abu who is on staff with the [employer].  After a thorough discussion of the risks, benefits, and alternatives of the surgery, the POA agreed to proceed.   Description of procedure: After informed consent was obtained from the patient, the patient was identified, the patient was brought to the operating room and placed in supine position.   General anesthesia was administered as well as perioperative IV antibiotics with 2g of ampicillin and 5mg /kg of gentamicin.  At the beginning of the case, a time-out was performed to properly identify the patient, the surgery to be performed, and the surgical site. Sequential compression devices were applied to lower extremities at the beginning of the case for DVT prophylaxis.   The patient was then placed into a prone position onto gel rolls, making sure that all pressure points were properly padded.  The patient's left flank and genitalia were then prepped and draped in sterile fashion.    A superstiff wire was passed through the 10 French nephroureteral stent down to the bladder, which we could see under fluoroscopy.  I then performed flexible cystoscopy and passed a sensor wire up to her renal pelvis.  I then used a 35 cm 12/14 French ureteral access sheath and passed this to the level of her renal pelvis.  I then introduced ureteroscopy and identified the large staghorn stone  within the renal pelvis.  I was able to bypass the stone to the previously obtained access at the posterior interpolar left calyx.  I was able to see the wire passing directly through this calyx.  I then left this ureteroscope in place.  I then passed an 8 French/10 [place] dilator over the superstiff wire proximally.  I then passed a dual-lumen catheter and passed a separate 0.038 sensor wire through the access sheath and out the patient.  I then removed the ureteral access sheath and placed a dual-lumen catheter over this through and through sensor wire.  I then passed a separate 0.038 sensor wire up to the level of the renal pelvis.  I then replaced my ureteral access sheath over this wire and remove the wire.  I then replaced my ureteroscope into this calyx.  Next, we made an incision around the wires. We then dilated the tract using a dual lumen.    The Bard X force balloon was then passed over the superstiff wire until the radiopaque tip was well into the posterior left interpolar calyx.  This was also done under direct vision.  We dilated this balloon to 18 cm of water pressure.  We advanced the sheath over the balloon.  We deflated the balloon, leaving the wire in place.   The rigid nephroscope and the Trilogy were inserted into the kidney. The stone burden was encountered. The Trilogy was used to fragement and suction out the stone. The Perc N-circle was used to remove large fragments. All fragments were removed.   Flexible nephroscopy was performed revealing no further fragments. Repeat retrograde pyeloscopy was performed using the ureteroscope.  There were 2 separate small stones within the ureter which were grasped with a 0 tip basket and extracted.  I then removed the ureteroscope and access sheath under direct vision noting no further stones along the course of the ureter and no trauma to the ureter.  Using a dual-lumen, I then passed a zip wire up to the level of the renal pelvis.  I then passed  a 7 [place] by 26 cm stent noting a proximal curl within the renal pelvis and bladder respectively.  We then advanced a 20-Fr council tip catheter over the superstiff wire into the renal pelvis. The sheath was removed. There was no significant bleeding. A repeat antegrade nephrostogram was performed demonstrating no filling defects and no extravasation of contrast. The balloon was filled with 3cc of sterile water.   We then sutured the Councill tip catheter into position using a couple of 0 silk sutures.  The incision was closed with a 4-0 vicryl suture. Sterile 4x4's and ABD pad were placed around the nephrostomy tube.  Several large OpSite dressings were placed over this.  A 16 French Foley catheter was placed to gravity drainage.   At this point, the procedure was completed. She was then transferred to the supine position and recovery room in stable condition. The patient tolerated the procedure well.  There were no immediate complications.  Plan:  Patient will be observed overnight. CT A/P non-con will be obtained to assess remaining stone burden. We will plan to clamp the nephrostomy tube tomorrow and remove prior to discharge tomorrow if  no increased pain or fevers.  Matt R. Niti Leisure MD [facility]  Pager: [phone]

## 2021-07-26 NOTE — Discharge Instructions (Signed)
[facility] [phone] Post PCNL With  Stent Instructions  Definitions:  Ureter: The duct that transports urine from the kidney to the bladder. Stent:   A plastic hollow tube that is placed into the ureter, from the kidney to the bladder to prevent the ureter from swelling shut.  GENERAL INSTRUCTIONS:  The stent is a foreign body which will further irritate the bladder wall. This irritation is manifested by increased frequency of urination, both day and night, and by an increase in the urge to urinate. In some, the urge to urinate is present almost always. Sometimes the urge is strong enough that you may not be able to stop yourself from urinating. The only real cure is to remove the stent and then give time for the bladder wall to heal which can't be done until the danger of the ureter swelling shut has passed, which varies.  You may see some blood in your urine while the stent is in place and a few days afterwards. Do not be alarmed, even if the urine was clear for a while. Get off your feet and drink lots of fluids until clearing occurs. If you start to pass clots or don't improve, call us.  DIET: You may return to your normal diet immediately. Because of the raw surface of your bladder, alcohol, spicy foods, acid type foods and drinks with caffeine may cause irritation or frequency and should be used in moderation. To keep your urine flowing freely and to avoid constipation, drink plenty of fluids during the day ( 8-10 glasses ). Tip: Avoid cranberry juice because it is very acidic.  ACTIVITY: Avoid heavy lifting or exercise for 5 days.  BOWELS: It is important to keep your bowels regular during the postoperative period. Straining with bowel movements can cause bleeding. A bowel movement every other day is reasonable. Use a mild laxative if needed, such as Milk of Magnesia 2-3 tablespoons, or 2 Dulcolax tablets. Call if you continue to have problems. If you have been  taking narcotics for pain, before, during or after your surgery, you may be constipated. Take a laxative if necessary.   MEDICATION: You should resume your pre-surgery medications unless told not to. In addition you will often be given an antibiotic to prevent infection. These should be taken as prescribed until the bottles are finished unless you are having an unusual reaction to one of the drugs.  PROBLEMS YOU SHOULD REPORT TO [place]: Fevers over 100.5 Fahrenheit. Heavy bleeding, or clots ( See above notes about blood in urine ). Inability to urinate. Drug reactions ( hives, rash, nausea, vomiting, diarrhea ). Severe burning or pain with urination that is not improving.  FOLLOW-UP: You will need a follow-up appointment to monitor your progress. Call for this appointment at the number listed above. Usually the first appointment will be about three to fourteen days after your surgery.

## 2021-07-26 NOTE — Anesthesia Postprocedure Evaluation (Signed)
Anesthesia Post Note  Patient: GEORGIANNE GRITZ  Procedure(s) Performed: NEPHROLITHOTOMY PERCUTANEOUS (Left) URETEROSCOPY WITH HOLMIUM LASER LITHOTRIPSY (Left) CYSTOSCOPY WITH STENT PLACEMENT (Left)     Patient location during evaluation: PACU Anesthesia Type: General Level of consciousness: awake and alert and oriented Pain management: pain level controlled Vital Signs Assessment: post-procedure vital signs reviewed and stable Respiratory status: spontaneous breathing, nonlabored ventilation and respiratory function stable Cardiovascular status: blood pressure returned to baseline Postop Assessment: no apparent nausea or vomiting Anesthetic complications: no   No notable events documented.  Last Vitals:  Vitals:   07/26/21 1245 07/26/21 1300  BP: (!) 144/71 (!) 150/62  Pulse: (!) 52 (!) 57  Resp: 15 13  Temp: (!) 35.8 C (!) 35.9 C  SpO2: 100% 100%    Last Pain:  Vitals:   07/26/21 0743  TempSrc: Oral  PainSc: 0-No pain                 Shanda Howells

## 2021-07-26 NOTE — Anesthesia Procedure Notes (Signed)
Procedure Name: Intubation Date/Time: 07/26/2021 9:14 AM Performed by: Victoriano Lain, CRNA Pre-anesthesia Checklist: Patient identified, Emergency Drugs available and Suction available Patient Re-evaluated:Patient Re-evaluated prior to induction Oxygen Delivery Method: Circle system utilized Preoxygenation: Pre-oxygenation with 100% oxygen Induction Type: IV induction Ventilation: Mask ventilation without difficulty Laryngoscope Size: Mac and 4 Grade View: Grade I Tube type: Oral Tube size: 7.0 mm Number of attempts: 1 Airway Equipment and Method: Stylet Placement Confirmation: ETT inserted through vocal cords under direct vision, positive ETCO2 and breath sounds checked- equal and bilateral Secured at: 21 cm Tube secured with: Tape Dental Injury: Teeth and Oropharynx as per pre-operative assessment

## 2021-07-26 NOTE — Progress Notes (Signed)
Day of Surgery Subjective: Pain controlled. No nausea or emesis.  Objective: Vital signs in last 24 hours: Temp:  [96.3 F (35.7 C)-98.2 F (36.8 C)] 97.5 F (36.4 C) (08/19 1330) Pulse Rate:  [51-62] 53 (08/19 1315) Resp:  [13-20] 13 (08/19 1315) BP: (109-152)/(57-92) 152/57 (08/19 1315) SpO2:  [98 %-100 %] 100 % (08/19 1315)  Intake/Output from previous day: No intake/output data recorded. Intake/Output this shift: Total I/O In: 1216.8 [I.V.:1009; IV Piggyback:207.8] Out: 150 [Blood:150]  Physical Exam:  General: Alert and oriented CV: RRR Lungs: Clear Abdomen: Soft, ND, NT Ext: NT, No erythema  Lab Results: Recent Labs    07/26/21 1210  HGB 14.1  HCT 42.1   BMET Recent Labs    07/26/21 1210  NA 138  K 4.3  CL 106  CO2 24  GLUCOSE 111*  BUN 16  CREATININE 1.59*  CALCIUM 9.5     Studies/Results: DG Chest Port 1 View  Result Date: 07/26/2021 CLINICAL DATA:  Status post nephrostomy tube placement. Evaluate for pneumothorax. EXAM: PORTABLE CHEST 1 VIEW COMPARISON:  None FINDINGS: The heart size and mediastinal contours are within normal limits. No pneumothorax identified. Both lungs are clear. The visualized skeletal structures are unremarkable. IMPRESSION: No pneumothorax visualized. Electronically Signed   By: Signa Kell M.D.   On: 07/26/2021 12:54   DG C-Arm 1-60 Min-No Report  Result Date: 07/26/2021 Fluoroscopy was utilized by the requesting physician.  No radiographic interpretation.    Assessment/Plan: Left staghorn stone s/p L PCNL 07/26/2021 Dementia: Pt has a POA, [employer] (who is on staff with [employer]). Anne's cell is [phone]. The phone number for [employer] is [phone]. Pt also has a caregiver, Maxine Glenn who can be reached at [phone]  -Pain control prn -Diet as tolerated -CXR without evidence of pneumothorax -Labs reviewed. Cr 1.59, elevated from baseline of 1.3. Hgb stable -Labs in  AM -CT in A/P in AM to assess for any remaining stone burden -Plan to clamp PCN tomorrow and remove tomorrow if no increased pain or fevers -She has an indwelling left ureteral stent that will be removed in the office in 2 weeks. -She is ok to discharge back to assisted living, hopefully on Saturday. I discussed with POA, Barnett Abu and her caregiver, Maxine Glenn.   LOS: 1 day   Matt R. Marjorie Deprey MD 07/26/2021, 1:39 PM [facility]  Pager: [phone]

## 2021-07-27 ENCOUNTER — Other Ambulatory Visit: Payer: Self-pay

## 2021-07-27 ENCOUNTER — Encounter (HOSPITAL_COMMUNITY): Payer: Self-pay | Admitting: Urology

## 2021-07-27 ENCOUNTER — Inpatient Hospital Stay (HOSPITAL_COMMUNITY): Payer: Medicare Other

## 2021-07-27 LAB — BASIC METABOLIC PANEL
Anion gap: 8 (ref 5–15)
Anion gap: 6 (ref 5–15)
BUN: 19 mg/dL (ref 8–23)
BUN: 16 mg/dL (ref 8–23)
CO2: 26 mmol/L (ref 22–32)
CO2: 27 mmol/L (ref 22–32)
Calcium: 9.3 mg/dL (ref 8.9–10.3)
Calcium: 9.6 mg/dL (ref 8.9–10.3)
Chloride: 104 mmol/L (ref 98–111)
Chloride: 108 mmol/L (ref 98–111)
Creatinine, Ser: 1.43 mg/dL — ABNORMAL HIGH (ref 0.44–1.00)
Creatinine, Ser: 1.2 mg/dL — ABNORMAL HIGH (ref 0.44–1.00)
GFR, Estimated: 36 mL/min — ABNORMAL LOW (ref >60)
GFR, Estimated: 45 mL/min — ABNORMAL LOW (ref >60)
Glucose, Bld: 96 mg/dL (ref 70–99)
Glucose, Bld: 93 mg/dL (ref 70–99)
Potassium: 5.2 mmol/L — ABNORMAL HIGH (ref 3.5–5.1)
Potassium: 3.9 mmol/L (ref 3.5–5.1)
Sodium: 138 mmol/L (ref 135–145)
Sodium: 141 mmol/L (ref 135–145)

## 2021-07-27 LAB — CBC
HCT: 41.8 % (ref 36.0–46.0)
Hemoglobin: 13.6 g/dL (ref 12.0–15.0)
MCH: 30.5 pg (ref 26.0–34.0)
MCHC: 32.5 g/dL (ref 30.0–36.0)
MCV: 93.7 fL (ref 80.0–100.0)
Platelets: 178 10*3/uL (ref 150–400)
RBC: 4.46 MIL/uL (ref 3.87–5.11)
RDW: 13.6 % (ref 11.5–15.5)
WBC: 10.6 10*3/uL — ABNORMAL HIGH (ref 4.0–10.5)
nRBC: 0 % (ref 0.0–0.2)

## 2021-07-27 NOTE — TOC Progression Note (Addendum)
Transition of Care [facility]) - Progression Note    Patient Details  Name: Annette Vaughn MRN: [id number] Date of Birth: March 17, 1935  [facility] CM/SW Contact  Geni Bers, RN Phone Number: 07/27/2021, 1:29 PM  Clinical Narrative:    Pt from ALF at [facility]. Pt will need PT eval and COVID test before returning to ALF. If pt only need PT may return to ALF with PT. ALF will provide PT there. Pt will need FL2 when discharge summary is ready to fax to ALF [phone].    Expected Discharge Plan: Assisted Living Barriers to Discharge: No Barriers Identified  Expected Discharge Plan and Services Expected Discharge Plan: Assisted Living       Living arrangements for the past 2 months: Assisted Living Facility                                       Social Determinants of Health (SDOH) Interventions    Readmission Risk Interventions No flowsheet data found.

## 2021-07-27 NOTE — Evaluation (Signed)
Physical Therapy Evaluation Patient Details Name: Annette Vaughn MRN: [id number] DOB: [birth year] Today's Date: 07/27/2021   History of Present Illness  86 year old patient adm with sepsis d/t urinary source. urology consulted  for a left staghorn renal stone. s/p LEFT percutaneous nephrostolithotomy 07/27/19 + antegrade stent.   PMH: L nephrostomy, dementia, HTN  Clinical Impression  Patient evaluated by Physical Therapy with no further acute PT needs identified. All education has been completed and the patient has no further questions.  Pt amb ~ 400' without device, unsteady but without overt LOB. Pt is at or very near her baseline per caregiver. Pt is pleasant and cooperative however unreliable source of PLOF/info. Ready to return to ALF from PT standpoint with previous level of assist/supervision.  See below for any follow-up Physical Therapy or equipment needs. PT is signing off. Thank you for this referral.     Follow Up Recommendations No PT follow up    Equipment Recommendations  None recommended by PT    Recommendations for Other Services       Precautions / Restrictions Precautions Precautions: Fall Restrictions Weight Bearing Restrictions: No      Mobility  Bed Mobility               General bed mobility comments: NT-in recliner, no assist needed per caregiver    Transfers Overall transfer level: Needs assistance   Transfers: Sit to/from Stand Sit to Stand: Supervision         General transfer comment: for safety. pt stands without warning, follows instruction from caregiver to stay seated.waits and then  stands on second trial at PT request without assist  Ambulation/Gait Ambulation/Gait assistance: Supervision;Min guard Gait Distance (Feet): 400 Feet Assistive device: None Gait Pattern/deviations: Step-through pattern;Decreased stride length;Drifts right/left     General Gait Details: slight drifting, intermittentlu unsteadiness however  no overt  LOB  Stairs            Wheelchair Mobility    Modified Rankin (Stroke Patients Only)       Balance   Sitting-balance support: Feet supported;No upper extremity supported Sitting balance-Leahy Scale: Normal       Standing balance-Leahy Scale: Fair Standing balance comment: Fair +             High level balance activites: Direction changes;Turns;Head turns;Backward walking (very short distance backward amb, ~3' x2) High Level Balance Comments: no LOB with above             Pertinent Vitals/Pain Pain Assessment: No/denies pain    Home Living Family/patient expects to be discharged to:: Assisted living                 Additional Comments: memory care- ALF at [facility]    Prior Function           Comments: per cargiver pt is Independent ambulator at baseline     Hand Dominance        Extremity/Trunk Assessment   Upper Extremity Assessment Upper Extremity Assessment: Overall WFL for tasks assessed    Lower Extremity Assessment Lower Extremity Assessment: Overall WFL for tasks assessed       Communication   Communication: HOH  Cognition Arousal/Alertness: Awake/alert Behavior During Therapy: WFL for tasks assessed/performed Overall Cognitive Status: History of cognitive impairments - at baseline                                 General Comments: pt  is pleasantly confused, follows one step commands consistently. unreliable historian but teases and jokes      General Comments      Exercises     Assessment/Plan    PT Assessment Patent does not need any further PT services  PT Problem List         PT Treatment Interventions      PT Goals (Current goals can be found in the Care Plan section)  Acute Rehab PT Goals PT Goal Formulation: All assessment and education complete, DC therapy    Frequency     Barriers to discharge        Co-evaluation               AM-PAC PT "6 Clicks" Mobility  Outcome  Measure Help needed turning from your back to your side while in a flat bed without using bedrails?: None Help needed moving from lying on your back to sitting on the side of a flat bed without using bedrails?: None Help needed moving to and from a bed to a chair (including a wheelchair)?: None Help needed standing up from a chair using your arms (e.g., wheelchair or bedside chair)?: None Help needed to walk in hospital room?: A Little Help needed climbing 3-5 steps with a railing? : A Little 6 Click Score: 22    End of Session Equipment Utilized During Treatment: Gait belt Activity Tolerance: Patient tolerated treatment well Patient left: in chair;with call bell/phone within reach;with chair alarm set;with nursing/sitter in room   PT Visit Diagnosis: Difficulty in walking, not elsewhere classified (R26.2)    Time: 0277-4128 PT Time Calculation (min) (ACUTE ONLY): 22 min   Charges:   PT Evaluation $PT Eval Low Complexity: 1 Low          Kaylana Fenstermacher, PT  Acute Rehab Dept ([facility]) [phone] Pager [phone]  07/27/2021   [facility] 07/27/2021, 4:03 PM

## 2021-07-27 NOTE — Progress Notes (Signed)
1 Day Post-Op   Subjective/Chief Complaint:  1- Large Left Renal Stone - s/p LEFT percutaneous nephrostolithotomy 07/27/19 + antegrade stent. 8/20 Hgb 13.6, Cr 1.43, CT with minimal residual stone, foley and nephrostomy tube removed.   2 - Disposition - pt lives in [facility] care unit at [facility] at baseline. PT eval pending 8/20 to determine level of need at DC. Covid pending 8/20 as well.   Today "Annette Vaughn" is stable. NO fevers. She will need PT eval and negative C19 before returning to [facility].    Objective: Vital signs in last 24 hours: Temp:  [97.5 F (36.4 C)-97.9 F (36.6 C)] 97.9 F (36.6 C) (08/20 1317) Pulse Rate:  [54-73] 54 (08/20 1317) Resp:  [13-20] 13 (08/20 1317) BP: (101-145)/(52-84) 135/66 (08/20 1317) SpO2:  [97 %-100 %] 99 % (08/20 1317) Last BM Date: 07/27/21  Intake/Output from previous day: 08/19 0701 - 08/20 0700 In: 2207.9 [P.O.:120; I.V.:1780.2; IV Piggyback:307.7] Out: 900 [Urine:750; Blood:150] Intake/Output this shift: Total I/O In: 120 [P.O.:120] Out: 250 [Urine:250]  EXAM: NAD, pleasantly demented, AOx2, caregiver at bedside Non-labored breathign on RA RRR SNTND, stable moderate obesity Left flank neph tube in place / clamped, removed and dry dressing placed SCD's in place, no c/c/e.   Lab Results:  Recent Labs    07/26/21 1210 07/27/21 0511  WBC 4.9 10.6*  HGB 14.1 13.6  HCT 42.1 41.8  PLT 152 178   BMET Recent Labs    07/27/21 0511 07/27/21 1109  NA 138 141  K 5.2* 3.9  CL 104 108  CO2 26 27  GLUCOSE 96 93  BUN 19 16  CREATININE 1.43* 1.20*  CALCIUM 9.3 9.6   PT/INR No results for input(s): LABPROT, INR in the last 72 hours. ABG No results for input(s): PHART, HCO3 in the last 72 hours.  Invalid input(s): PCO2, PO2  Studies/Results: CT ABDOMEN PELVIS WO CONTRAST  Result Date: 07/27/2021 CLINICAL DATA:  Urinary tract stone, symptomatic or complicated. EXAM: CT ABDOMEN AND PELVIS WITHOUT CONTRAST TECHNIQUE:  Multidetector CT imaging of the abdomen and pelvis was performed following the standard protocol without IV contrast. COMPARISON:  05/17/2021 FINDINGS: Lower chest:  No contributory findings. Hepatobiliary: No focal liver abnormality.Cholelithiasis. Pancreas: Unremarkable. Spleen: Unremarkable. Adrenals/Urinary Tract: Left percutaneous nephrostolithotomy. Unremarkable positioning of percutaneous nephrostomy and internal ureteral stent. Remaining interpolar calculus measuring 6 mm. No visible stone in the ureter adjacent to the stent. No hydronephrosis or complicating hemorrhage. Bilateral renal cortical lobulation/scarring. Stomach/Bowel:  No obstruction. No visible inflammation. Vascular/Lymphatic: No unexpected vascular finding. No mass or adenopathy. Reproductive:Hysterectomy Other: No ascites or pneumoperitoneum. Musculoskeletal: No acute abnormalities. Advanced lumbar spine degeneration IMPRESSION: Left percutaneous nephrostolithotomy without complicating feature. A 6 mm stone persists in the left interpolar region. No stone seen in the ureter. Electronically Signed   By: Marnee Spring M.D.   On: 07/27/2021 10:26   DG Chest Port 1 View  Result Date: 07/26/2021 CLINICAL DATA:  Status post nephrostomy tube placement. Evaluate for pneumothorax. EXAM: PORTABLE CHEST 1 VIEW COMPARISON:  None FINDINGS: The heart size and mediastinal contours are within normal limits. No pneumothorax identified. Both lungs are clear. The visualized skeletal structures are unremarkable. IMPRESSION: No pneumothorax visualized. Electronically Signed   By: Signa Kell M.D.   On: 07/26/2021 12:54   DG C-Arm 1-60 Min-No Report  Result Date: 07/26/2021 Fluoroscopy was utilized by the requesting physician.  No radiographic interpretation.    Anti-infectives: Anti-infectives (From admission, onward)    Start     Dose/Rate Route  Frequency Ordered Stop   07/26/21 2000  cefTRIAXone (ROCEPHIN) 1 g in sodium chloride 0.9 % 100 mL  IVPB        1 g 200 mL/hr over 30 Minutes Intravenous Every 24 hours 07/26/21 1211 08/02/21 1959   07/26/21 0600  ampicillin (OMNIPEN) 2 g in sodium chloride 0.9 % 100 mL IVPB        2 g 300 mL/hr over 20 Minutes Intravenous  Once 07/25/21 0749 07/26/21 0935   07/26/21 0600  gentamicin (GARAMYCIN) 310 mg in dextrose 5 % 100 mL IVPB       Note to Pharmacy: PER PHARMACY DOSING   5 mg/kg  61.2 kg 107.8 mL/hr over 60 Minutes Intravenous 30 min pre-op 07/25/21 0727 07/26/21 1034   07/26/21 0000  cephALEXin (KEFLEX) 500 MG capsule        500 mg Oral 2 times daily 07/26/21 0844 07/29/21 2359       Assessment/Plan:  1- Large Left Renal Stone - tubes now out, only stent remains. No further stone-directed care this admission.   2 - Disposition - pt OK for DC from GU perspective at anytime but will need PT eval and C19 testing. Ordered. Possible DC tomorrow pending needes assesment.    Annette Vaughn 07/27/2021

## 2021-07-28 LAB — BASIC METABOLIC PANEL
Anion gap: 7 (ref 5–15)
BUN: 21 mg/dL (ref 8–23)
CO2: 25 mmol/L (ref 22–32)
Calcium: 9.2 mg/dL (ref 8.9–10.3)
Chloride: 107 mmol/L (ref 98–111)
Creatinine, Ser: 1.26 mg/dL — ABNORMAL HIGH (ref 0.44–1.00)
GFR, Estimated: 42 mL/min — ABNORMAL LOW (ref >60)
Glucose, Bld: 86 mg/dL (ref 70–99)
Potassium: 4.4 mmol/L (ref 3.5–5.1)
Sodium: 139 mmol/L (ref 135–145)

## 2021-07-28 LAB — CBC
HCT: 38.1 % (ref 36.0–46.0)
Hemoglobin: 12.7 g/dL (ref 12.0–15.0)
MCH: 30.9 pg (ref 26.0–34.0)
MCHC: 33.3 g/dL (ref 30.0–36.0)
MCV: 92.7 fL (ref 80.0–100.0)
Platelets: 169 10*3/uL (ref 150–400)
RBC: 4.11 MIL/uL (ref 3.87–5.11)
RDW: 14 % (ref 11.5–15.5)
WBC: 7.5 10*3/uL (ref 4.0–10.5)
nRBC: 0 % (ref 0.0–0.2)

## 2021-07-28 LAB — SARS CORONAVIRUS 2 (TAT 6-24 HRS): SARS Coronavirus 2: NEGATIVE

## 2021-07-28 MED ORDER — COVID-19 MRNA VAC-TRIS(PFIZER) 30 MCG/0.3ML IM SUSP
0.3000 mL | Freq: Once | INTRAMUSCULAR | Status: AC
  Administered 2021-07-28: 0.3 mL via INTRAMUSCULAR
  Filled 2021-07-28: qty 0.3

## 2021-07-28 NOTE — Plan of Care (Signed)
  Problem: Clinical Measurements: Goal: Ability to maintain clinical measurements within normal limits will improve Outcome: Progressing Goal: Will remain free from infection Outcome: Progressing Goal: Respiratory complications will improve Outcome: Progressing   Problem: Pain Managment: Goal: General experience of comfort will improve Outcome: Progressing   

## 2021-07-28 NOTE — TOC Transition Note (Signed)
Transition of Care [facility]) - CM/SW Discharge Note   Patient Details  Name: Annette Vaughn MRN: [id number] Date of Birth: March 02, 1935  Transition of Care [facility]) CM/SW Contact:  Marina Goodell Phone Number: [phone] 07/28/2021, 3:44 PM   Clinical Narrative:     Patient will d/c to [facility] ALF room 2014, report [phone], [facility]. CSW left v/m for patient's POA. PTAR transport scheduled. Attending and Unit RN updated.    Final next level of care: Assisted Living Barriers to Discharge: No Barriers Identified   Patient Goals and CMS Choice Patient states their goals for this hospitalization and ongoing recovery are:: To get better CMS Medicare.gov Compare Post Acute Care list provided to:: Patient Choice offered to / list presented to : Patient  Discharge Placement                       Discharge Plan and Services                                     Social Determinants of Health (SDOH) Interventions     Readmission Risk Interventions No flowsheet data found.

## 2021-07-28 NOTE — Progress Notes (Signed)
Second attempt to reach CM in regards to Pt being d/c back to [facility]. Spoke with MD he left messages as well. For CM. With no return call back either [phone]. Cont with current plan of care

## 2021-07-28 NOTE — Discharge Summary (Signed)
Physician Discharge Summary  Patient ID: Annette Vaughn MRN: [id number] DOB/AGE: [id number] 86 y.o.  Admit date: 07/26/2021 Discharge date: 07/28/2021  Admission Diagnoses: Large Left Renal Stone, Dementia  Discharge Diagnoses:  Active Problems:   Left renal stone   Discharged Condition: fair  Hospital Course:   1- Large Left Renal Stone - s/p LEFT percutaneous nephrostolithotomy 07/27/19 + antegrade stent. 8/20 Hgb 13.6, Cr 1.43, CT with minimal residual stone, foley and nephrostomy tube removed.    2 - Disposition - pt lives in [facility] care unit at [facility] at baseline. PT eval pending 8/20 confirms OK for ALF unit, does not need higher level. C19 test 8/20 negative.    By the AM of 07/28/21, she is ambulatory, maintaining PO nutrition, voiding, and felt to be adequate for transfter back to memory care unit of [facility].   Consults: None  Significant Diagnostic Studies: labs: as per above  Treatments: surgery: as per aboe  Discharge Exam: Blood pressure (!) 144/61, pulse 70, temperature 98.5 F (36.9 C), temperature source Oral, resp. rate 20, height 5\' 8"  (1.727 m), weight 70 kg, SpO2 100 %.  NAD, AOx1, caretaker at bedside Non-labored breathign on room air Stably kyphosis RRR SNTND, mild obesity Left flank dressing c/d/I. NO hematomas No c/c/e  Disposition: Discharge disposition: 70-Another Health Care Institution Not Defined          Follow-up Information     [facility] Follow up on 08/13/2021.   Why: 9AM Contact information: [address] [place] [phone]                Signed: [phone] 07/28/2021, 8:46 AM

## 2021-07-28 NOTE — Progress Notes (Signed)
Initial Nutrition Assessment  DOCUMENTATION CODES:   Not applicable  INTERVENTION:   -Continue Ensure Enlive po BID, each supplement provides 350 kcal and 20 grams of protein   NUTRITION DIAGNOSIS:   Inadequate oral intake related to decreased appetite as evidenced by meal completion < 50%.  GOAL:   Patient will meet greater than or equal to 90% of their needs  MONITOR:   PO intake, Supplement acceptance, Labs, Weight trends, Skin, I & O's  REASON FOR ASSESSMENT:   Malnutrition Screening Tool    ASSESSMENT:   Annette Vaughn is a very pleasant 87 year old patient with a history of dementia who is seen in consultation for a left staghorn renal stone.  Pt admitted with lt staghorn renal stone.   8/19- s/p Procedure(s): 1.  Left percutaneous nephrolithotomy (greater than 2 cm) 2. Dilation of left renal percutaneous tract under fluoroscopy 3. Left antegrade ureteroscopy with basket stone extraction 4. Left antegrade nephrostogram 5. Fluoroscopy time less than 1 hour with interpretation 6. Left ureteral stent placement 7. Left percutaneous nephrostomy tube placement 8. Cystoscopy  Reviewed I/O's: +150 ml x 24 hours and +1.5 L since admission  UOP: 250 ml x 24 hours  Pt unavailable at time of visit. Attempted to speak with pt via call to hospital room phone, however, unable to reach. RD unable to obtain further nutrition-related history or complete nutrition-focused physical exam at this time.    Pt with poor oral intake. Noted meal completions 10-20%. Pt is consuming Ensure Enlive supplements.  Reviewed wt hx; pt with no wt loss over the past 3 months.   Per chart review, pt is stable to return to ALF today.  Medications reviewed and include colace.   Labs reviewed.    Diet Order:   Diet Order             Diet regular Room service appropriate? Yes; Fluid consistency: Thin  Diet effective now                   EDUCATION NEEDS:   Not  appropriate for education at this time  Skin:  Skin Assessment: Skin Integrity Issues: Skin Integrity Issues:: Incisions Incisions: closed perineum, lt back  Last BM:  07/27/21  Height:   Ht Readings from Last 1 Encounters:  07/27/21 5\' 8"  (1.727 m)    Weight:   Wt Readings from Last 1 Encounters:  07/27/21 70 kg    Ideal Body Weight:  63.6 kg  BMI:  Body mass index is 23.46 kg/m.  Estimated Nutritional Needs:   Kcal:  1550-1750  Protein:  70-85 grams  Fluid:  > 1.5 L    07/29/21, RD, LDN, CDCES Registered Dietitian II Certified Diabetes Care and Education Specialist Please refer to [facility] for RD and/or RD on-call/weekend/after hours pager

## 2021-07-28 NOTE — NC FL2 (Signed)
[place] MEDICAID FL2 LEVEL OF CARE SCREENING TOOL     IDENTIFICATION  Patient Name: Annette Vaughn Birthdate: [birth year] Sex: female Admission Date (Current Location): 07/26/2021  [facility] and [place] Number:  Producer, television/film/video and Address:  [facility],  [address], [place] [address]      Provider Number: [id number]  Attending Physician Name and Address:  Jannifer Hick, MD  Relative Name and Phone Number:  Suzzanne Cloud (Other)   [phone] (Mobile)    Current Level of Care: Other (Comment) ([facility]) Recommended Level of Care: Assisted Living Facility Prior Approval Number:    Date Approved/Denied:   PASRR Number:    Discharge Plan: Other (Comment) (ALF)    Current Diagnoses: Patient Active Problem List   Diagnosis Date Noted   Left renal stone 07/26/2021   Sepsis (HCC) 04/27/2021   Staghorn calculus 04/27/2021   Acute pyelonephritis 04/27/2021   AKI (acute kidney injury) (HCC) 04/27/2021   Essential hypertension 04/27/2021   Hyperlipemia 04/27/2021   Dementia without behavioral disturbance (HCC) 04/27/2021   Hyponatremia 04/27/2021   Thrombocytopenia (HCC) 04/27/2021   Cholelithiases 04/27/2021    Orientation RESPIRATION BLADDER Height & Weight     Self  Normal Incontinent Weight: 154 lb 5.2 oz (70 kg) Height:  5\' 8"  (172.7 cm)  BEHAVIORAL SYMPTOMS/MOOD NEUROLOGICAL BOWEL NUTRITION STATUS      Incontinent Diet  AMBULATORY STATUS COMMUNICATION OF NEEDS Skin   Limited Assist Verbally Normal                       Personal Care Assistance Level of Assistance  Bathing, Feeding, Dressing, Total care Bathing Assistance: Limited assistance Feeding assistance: Limited assistance Dressing Assistance: Limited assistance Total Care Assistance: Limited assistance   Functional Limitations Info  Sight, Hearing, Speech Sight Info: Adequate Hearing Info: Impaired Speech Info: Adequate    SPECIAL CARE FACTORS FREQUENCY                        Contractures Contractures Info: Not present    Additional Factors Info                  Current Medications (07/28/2021):  This is the current hospital active medication list Current Facility-Administered Medications  Medication Dose Route Frequency Provider Last Rate Last Admin   0.9 %  sodium chloride infusion  250 mL Intravenous PRN 07/30/2021, MD       acetaminophen (TYLENOL) tablet 650 mg  650 mg Oral Q4H PRN Jannifer Hick, MD   650 mg at 07/27/21 2233   belladonna-opium (B&O) suppository 16.2-60mg   1 suppository Rectal Q6H PRN 2234, MD       cefTRIAXone (ROCEPHIN) 1 g in sodium chloride 0.9 % 100 mL IVPB  1 g Intravenous Q24H Jannifer Hick, MD 200 mL/hr at 07/27/21 2030 1 g at 07/27/21 2030   Chlorhexidine Gluconate Cloth 2 % PADS 6 each  6 each Topical Daily 07/29/21, MD   6 each at 07/27/21 0945   docusate sodium (COLACE) capsule 100 mg  100 mg Oral BID 07/29/21, MD   100 mg at 07/28/21 07/30/21   donepezil (ARICEPT) tablet 10 mg  10 mg Oral QHS 6967, MD   10 mg at 07/27/21 2233   feeding supplement (ENSURE ENLIVE / ENSURE PLUS) liquid 237 mL  237 mL Oral BID BM 2234, MD   [phone]  mL at 07/28/21 1501   HYDROmorphone (DILAUDID) injection 0.5-1 mg  0.5-1 mg Intravenous Q2H PRN Jannifer Hick, MD   1 mg at 07/26/21 2306   ondansetron (ZOFRAN) injection 4 mg  4 mg Intravenous Q4H PRN Jannifer Hick, MD       oxybutynin (DITROPAN-XL) 24 hr tablet 10 mg  10 mg Oral Daily Jannifer Hick, MD   10 mg at 07/28/21 9735   oxyCODONE-acetaminophen (PERCOCET/ROXICET) 5-325 MG per tablet 1-2 tablet  1-2 tablet Oral Q4H PRN Jannifer Hick, MD   1 tablet at 07/26/21 1432   polyethylene glycol (MIRALAX / GLYCOLAX) packet 17 g  17 g Oral Daily PRN Jannifer Hick, MD       sodium chloride flush (NS) 0.9 % injection 3 mL  3 mL Intravenous Q12H Jannifer Hick, MD   3 mL at 07/28/21 3299   sodium chloride flush (NS) 0.9 % injection 3 mL  3 mL  Intravenous PRN Jannifer Hick, MD         Discharge Medications: Please see discharge summary for a list of discharge medications.  Relevant Imaging Results:  Relevant Lab Results:   Additional Information [id number]  Joseph Art, LCSWA

## 2021-07-28 NOTE — Progress Notes (Signed)
Message left for CM to inquire about pt going back to facility

## 2021-07-28 NOTE — Progress Notes (Signed)
Report called to [facility]. Nurse Waymon Budge received report and confirmed acceptance of pt.. Also informed that foley and neph tube removed Only stents in place at this time. Tegaderm to  flank area and informed of irritation and some redness to back area.Informed that pt would be getting booster vaccine. Notified legal guardian Suzzanne Cloud of pt d/c and that pt would be getting booster per her request. Card for vaccine placed in pt packet. To facility. IV removed

## 2021-08-03 ENCOUNTER — Other Ambulatory Visit (HOSPITAL_COMMUNITY): Payer: Self-pay

## 2022-07-05 IMAGING — DX DG CHEST 1V PORT
1 series · 1 of 1 positions shown · non-contrast
Comparison: None.

CLINICAL DATA: 86-year-old female with fever.

EXAM:
PORTABLE CHEST 1 VIEW

[chest ap]
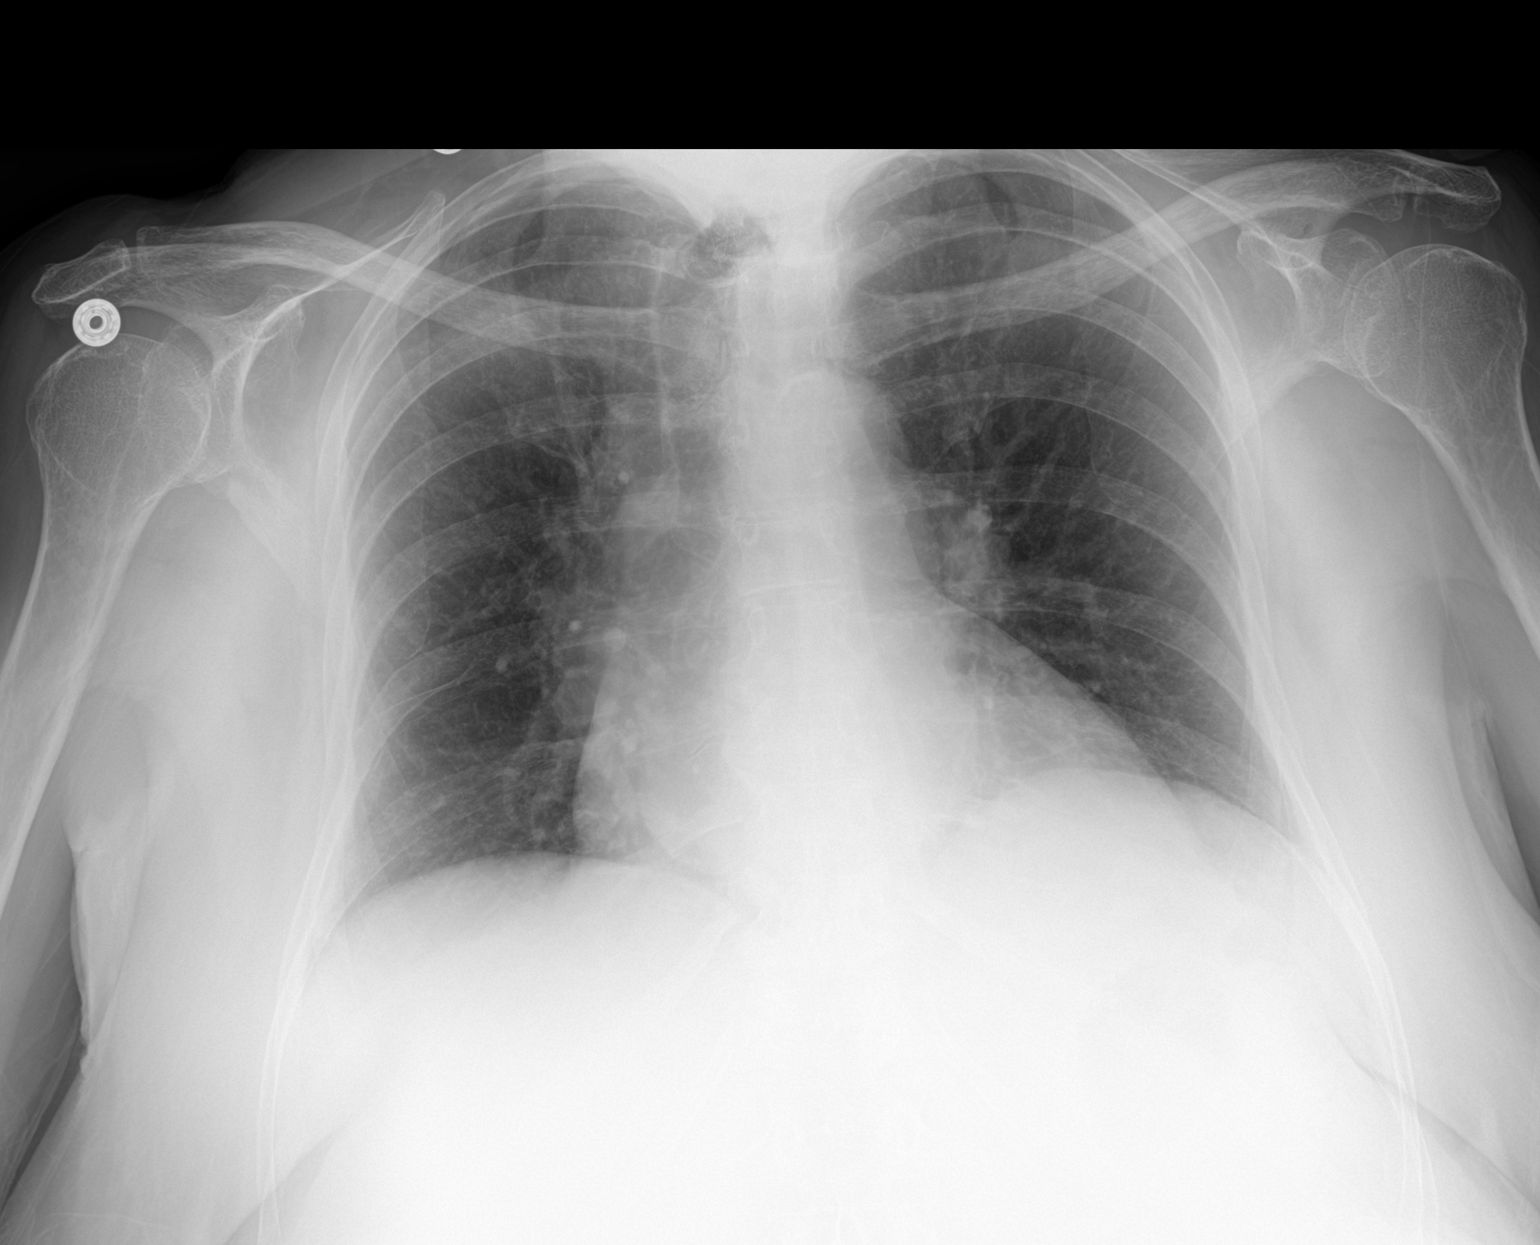

[1 of 1 positions shown; findings below may reference images not displayed]

FINDINGS: The cardiomediastinal silhouette is unremarkable.

There is no evidence of focal airspace disease, pulmonary edema,
suspicious pulmonary nodule/mass, pleural effusion, or pneumothorax.

No acute bony abnormalities are identified.
IMPRESSION: No active disease.

## 2022-10-04 IMAGING — CT CT ABD-PELV W/O CM
2 of 4 series · 17 of 46 positions shown, 19 images · non-contrast
Comparison: 05/17/2021

CLINICAL DATA: Urinary tract stone, symptomatic or complicated.

EXAM:
CT ABDOMEN AND PELVIS WITHOUT CONTRAST
TECHNIQUE: Multidetector CT imaging of the abdomen and pelvis was performed
following the standard protocol without IV contrast.

[Series 2: axial st · axial · 0.82mm/px · z∈[+1266,+1632]mm · 14 of 83 slices shown, 16 images]
[im 5/83  soft-tissue]
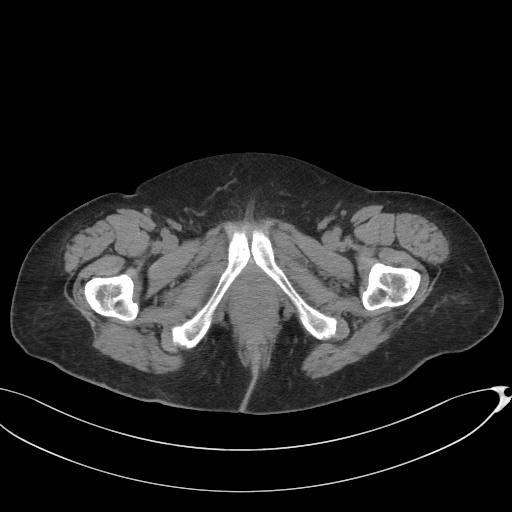
[im 5/83  bone]
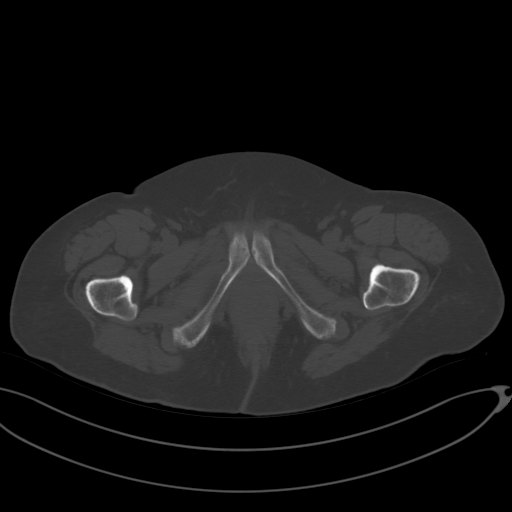
[im 9/83  soft-tissue]
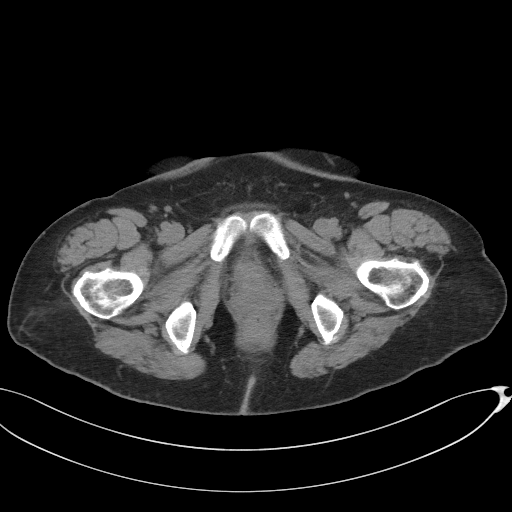
[im 18/83  soft-tissue]
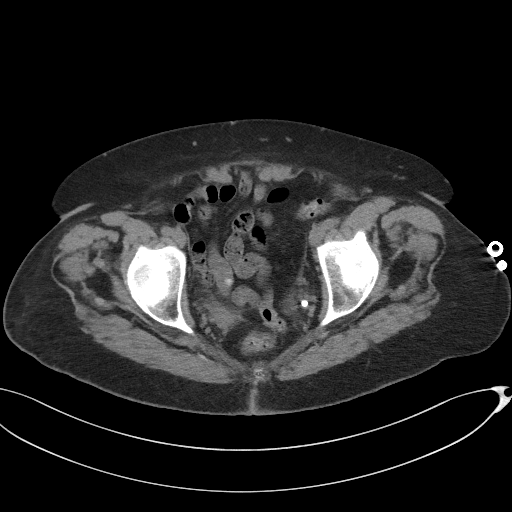
[im 22/83  soft-tissue]
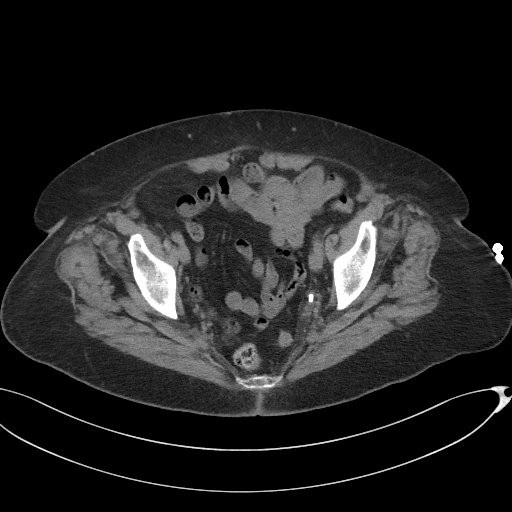
[im 26/83  soft-tissue]
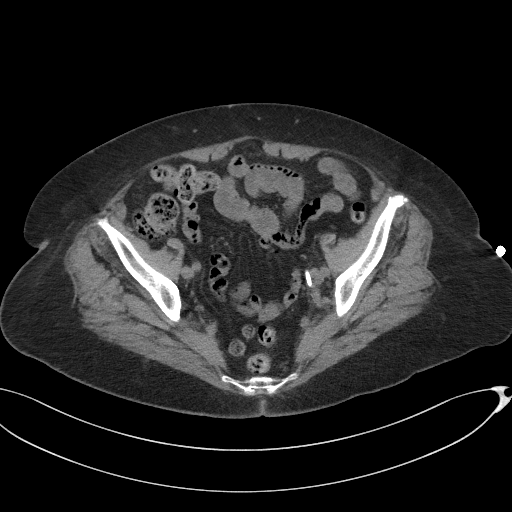
[im 35/83  soft-tissue]
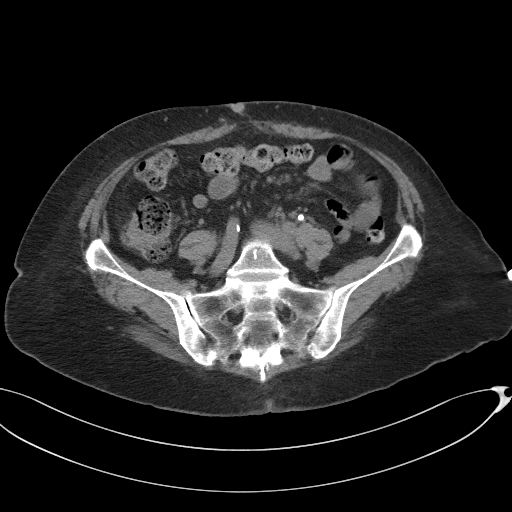
[im 39/83  soft-tissue]
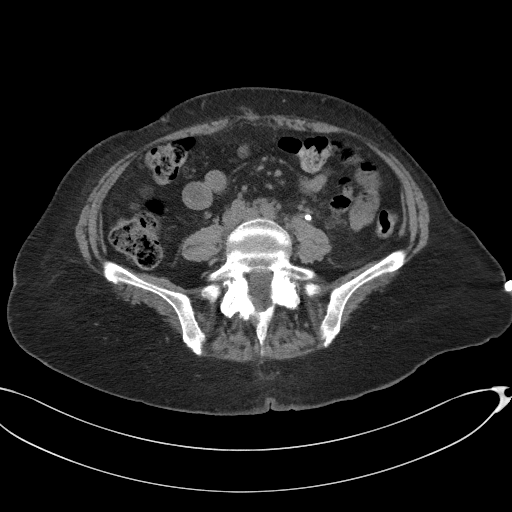
[im 44/83  soft-tissue]
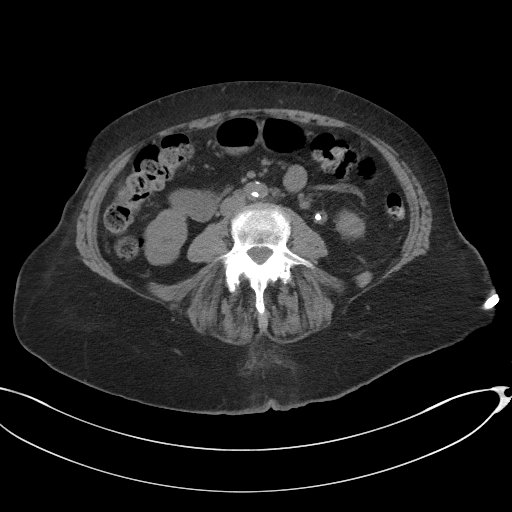
[im 48/83  soft-tissue]
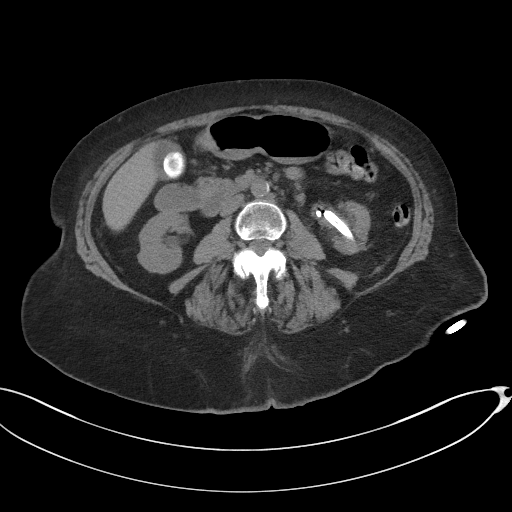
[im 48/83  bone]
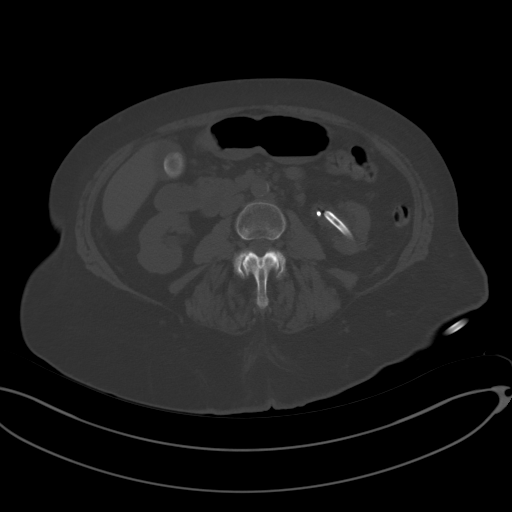
[im 57/83  soft-tissue]
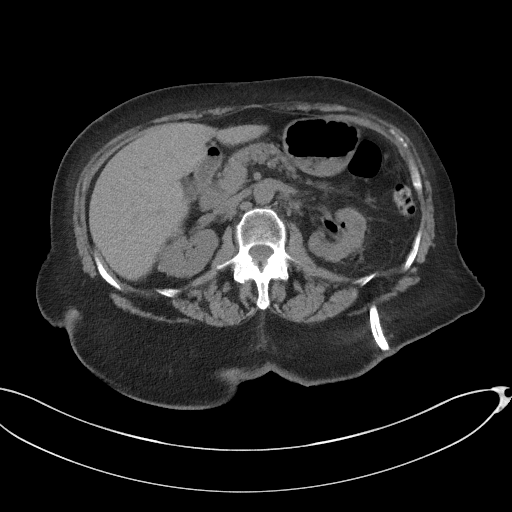
[im 61/83  soft-tissue]
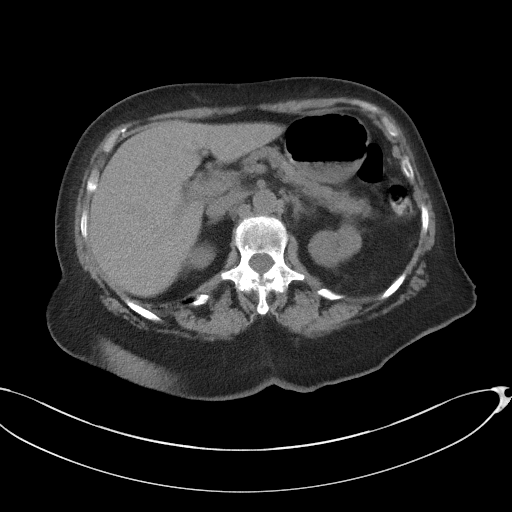
[im 65/83  soft-tissue]
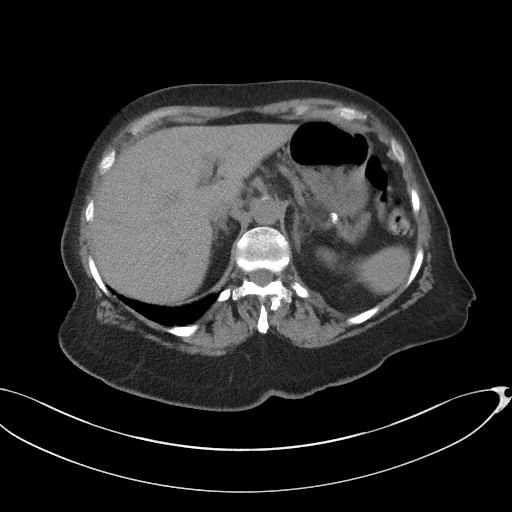
[im 74/83  soft-tissue]
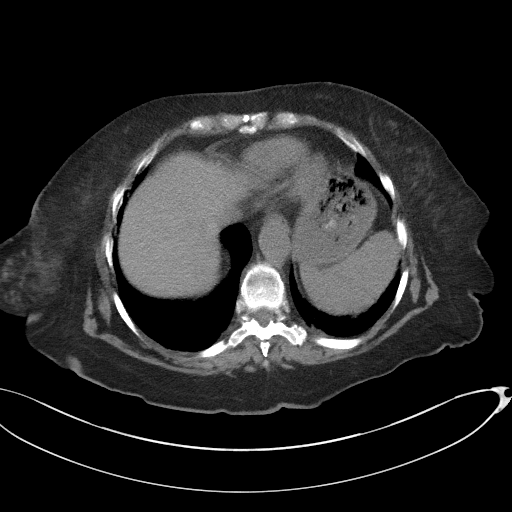
[im 78/83  soft-tissue]
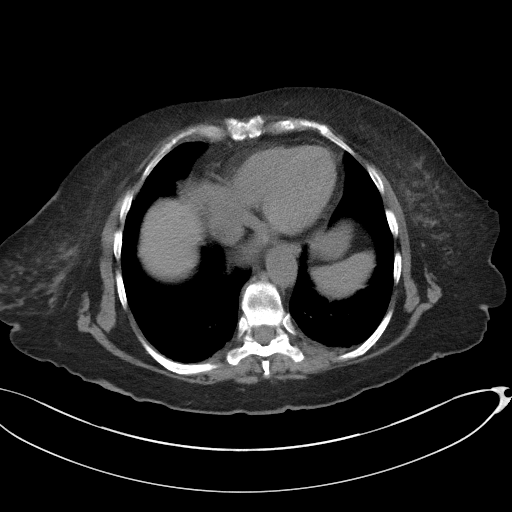

[Series 5: coronal st · coronal · 0.79mm/px · 3 of 100 slices shown]
[im 34/100  soft-tissue]
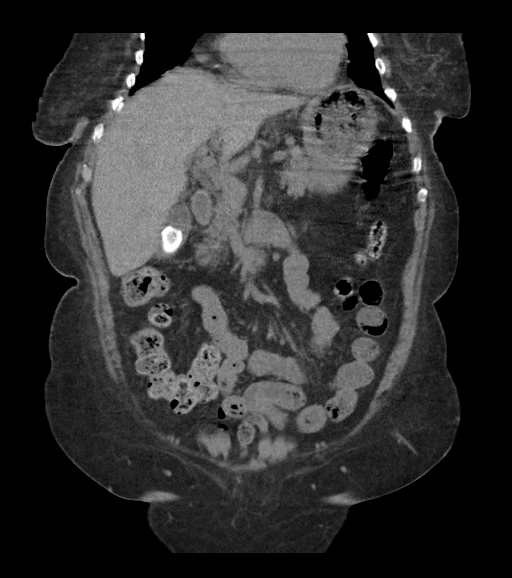
[im 45/100  soft-tissue]
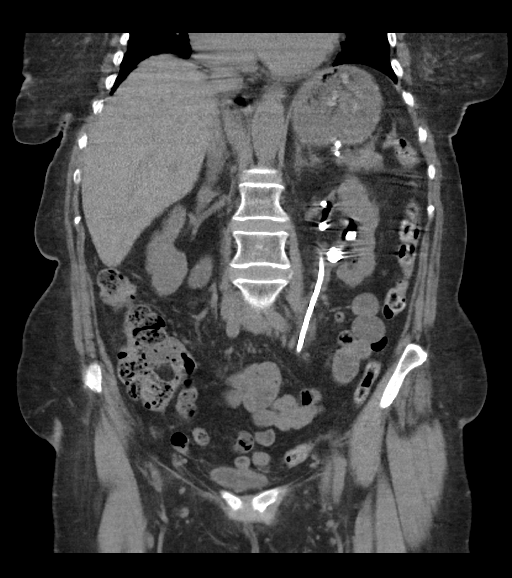
[im 56/100  soft-tissue]
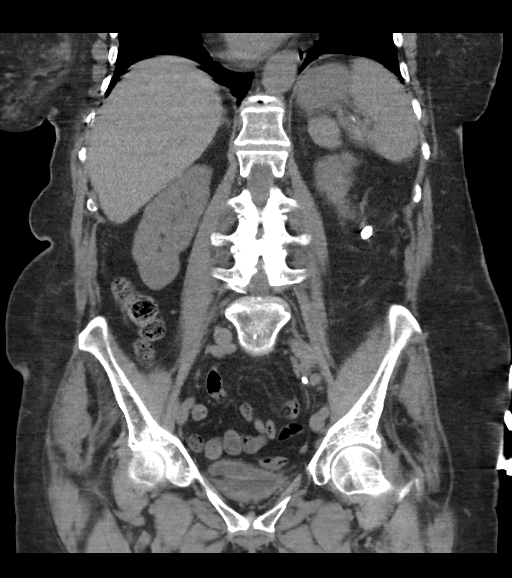

[17 of 46 positions shown; findings below may reference images not displayed]

FINDINGS: Lower chest:  No contributory findings.

Hepatobiliary: No focal liver abnormality.Cholelithiasis.

Pancreas: Unremarkable.

Spleen: Unremarkable.

Adrenals/Urinary Tract: Left percutaneous nephrostolithotomy.
Unremarkable positioning of percutaneous nephrostomy and internal
ureteral stent. Remaining interpolar calculus measuring 6 mm. No
visible stone in the ureter adjacent to the stent. No hydronephrosis
or complicating hemorrhage. Bilateral renal cortical
lobulation/scarring.

Stomach/Bowel:  No obstruction. No visible inflammation.

Vascular/Lymphatic: No unexpected vascular finding. No mass or
adenopathy.

Reproductive:Hysterectomy

Other: No ascites or pneumoperitoneum.

Musculoskeletal: No acute abnormalities. Advanced lumbar spine
degeneration
IMPRESSION: Left percutaneous nephrostolithotomy without complicating feature. A
6 mm stone persists in the left interpolar region. No stone seen in
the ureter.
